# Patient Record
Sex: Male | Born: 1955 | Race: White | Hispanic: No | Marital: Married
Health system: Midwestern US, Academic
[De-identification: ages and names within clinical notes are randomized; demographics above are authoritative.]

---

## 2017-06-24 ENCOUNTER — Ambulatory Visit: Admit: 2017-06-24 | Discharge: 2017-07-08 | Payer: MEDICARE

## 2017-06-27 LAB — COMPREHENSIVE METABOLIC PANEL
Lab: 0.6
Lab: 143 — ABNORMAL LOW (ref 4.70–6.10)
Lab: 3.8
Lab: 6.9
Lab: 81

## 2017-06-27 LAB — LIPID PROFILE
Lab: 13
Lab: 145 — ABNORMAL LOW (ref 150–200)
Lab: 3

## 2017-06-27 LAB — THYROID STIMULATING HORMONE-TSH: Lab: 0.3 — ABNORMAL LOW (ref 0.35–4.94)

## 2017-06-27 LAB — CBC: Lab: 8.3

## 2017-06-27 LAB — PROSTATIC SPECIFIC ANTIGEN-PSA: Lab: 1

## 2017-06-27 LAB — HEMOGLOBIN A1C: Lab: 5.2 — ABNORMAL HIGH (ref 27.0–31.0)

## 2017-07-04 ENCOUNTER — Ambulatory Visit: Admit: 2017-07-04 | Discharge: 2017-07-04 | Payer: MEDICARE

## 2017-07-04 ENCOUNTER — Encounter: Admit: 2017-07-04 | Discharge: 2017-07-04 | Payer: MEDICARE

## 2017-07-04 DIAGNOSIS — C61 Malignant neoplasm of prostate: ICD-10-CM

## 2017-07-04 DIAGNOSIS — G510 Bell's palsy: ICD-10-CM

## 2017-07-04 DIAGNOSIS — H1851 Endothelial corneal dystrophy: ICD-10-CM

## 2017-07-04 DIAGNOSIS — I499 Cardiac arrhythmia, unspecified: ICD-10-CM

## 2017-07-04 DIAGNOSIS — E669 Obesity, unspecified: ICD-10-CM

## 2017-07-04 DIAGNOSIS — J302 Other seasonal allergic rhinitis: ICD-10-CM

## 2017-07-04 DIAGNOSIS — M1990 Unspecified osteoarthritis, unspecified site: ICD-10-CM

## 2017-07-04 DIAGNOSIS — I10 Essential (primary) hypertension: ICD-10-CM

## 2017-07-04 DIAGNOSIS — E119 Type 2 diabetes mellitus without complications: Principal | ICD-10-CM

## 2017-07-08 DIAGNOSIS — C61 Malignant neoplasm of prostate: Principal | ICD-10-CM

## 2017-07-24 ENCOUNTER — Encounter: Admit: 2017-07-24 | Discharge: 2017-07-24 | Payer: MEDICARE

## 2017-07-24 DIAGNOSIS — C61 Malignant neoplasm of prostate: Principal | ICD-10-CM

## 2017-07-24 DIAGNOSIS — E042 Nontoxic multinodular goiter: ICD-10-CM

## 2017-07-24 MED ORDER — RP DX F-18 FLUCICLOVINE MCI
10 | Freq: Once | INTRAVENOUS | 0 refills | Status: CP
Start: 2017-07-24 — End: ?

## 2017-07-25 ENCOUNTER — Encounter: Admit: 2017-07-25 | Discharge: 2017-07-25 | Payer: MEDICARE

## 2017-07-25 DIAGNOSIS — E041 Nontoxic single thyroid nodule: Principal | ICD-10-CM

## 2017-08-03 IMAGING — US THYROID
1 series · 14 of 25 positions shown · non-contrast
Comparison: none

[Series 1: us thyroid · 14 of 66 slices shown]
[im 1/66]
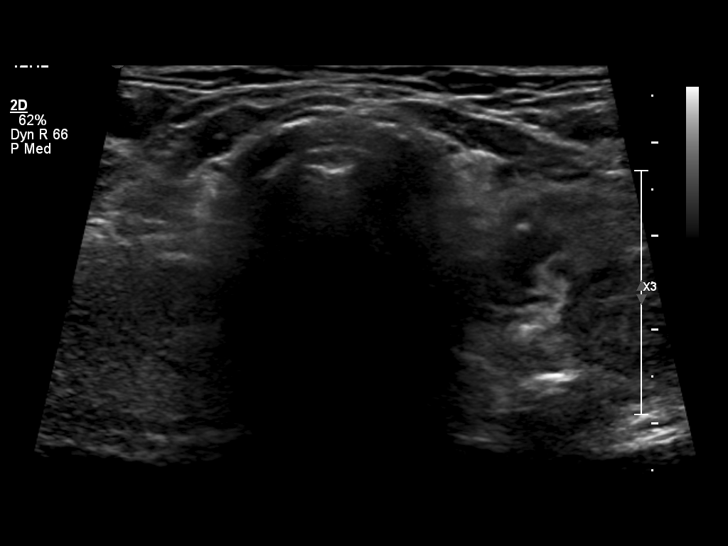
[im 6/66]
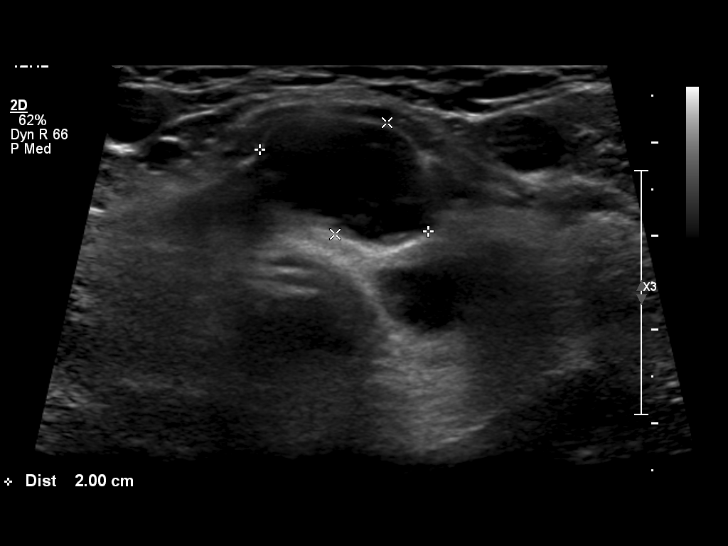
[im 11/66]
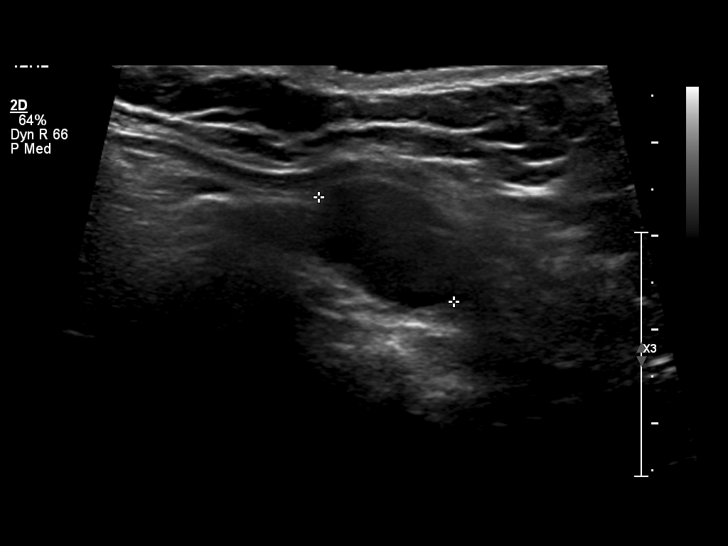
[im 17/66]
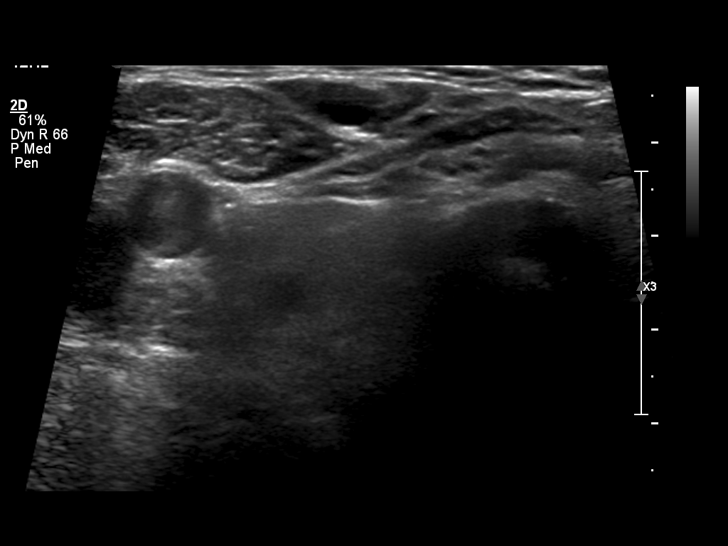
[im 22/66]
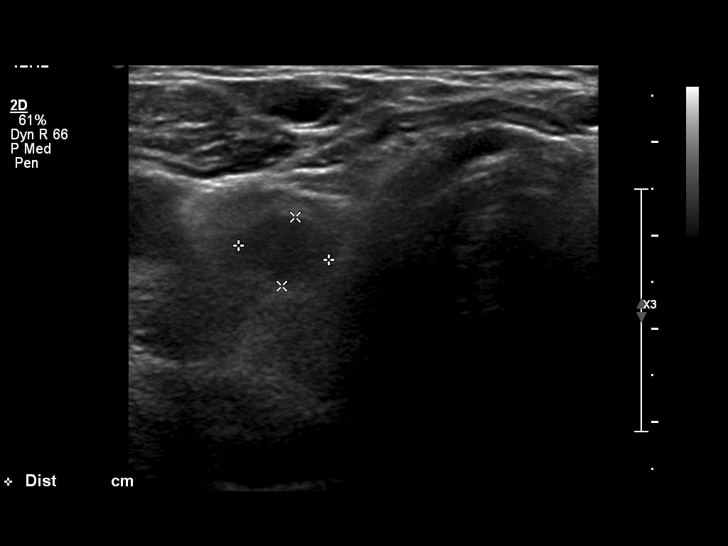
[im 25/66]
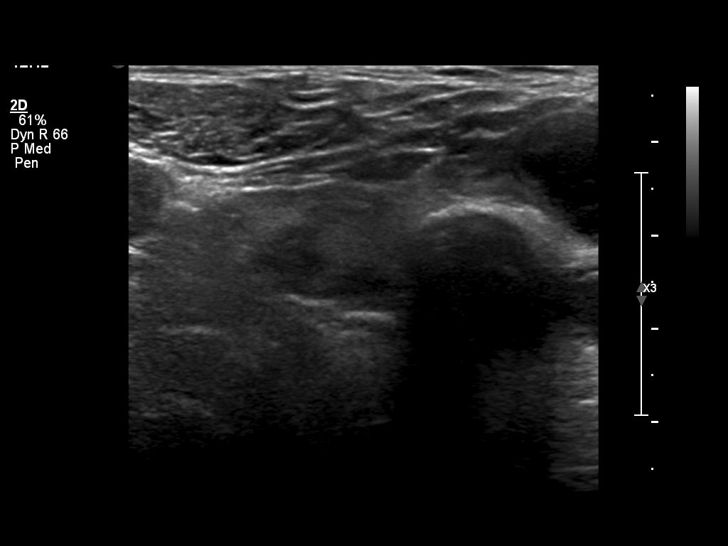
[im 30/66]
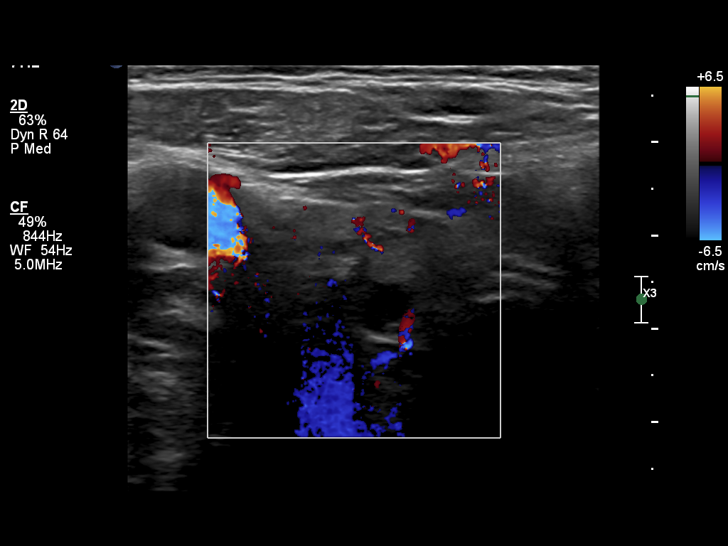
[im 36/66]
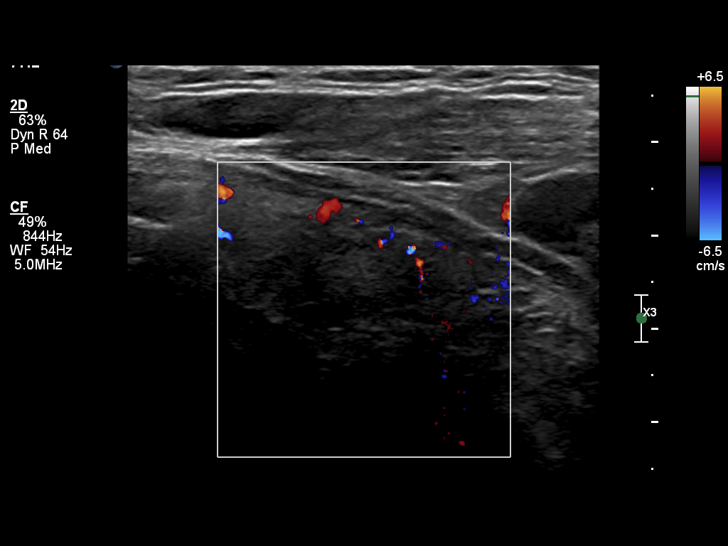
[im 41/66]
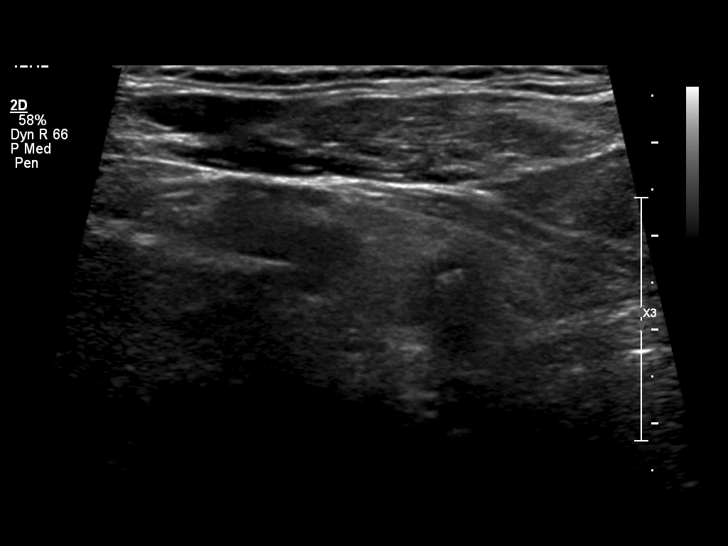
[im 44/66]
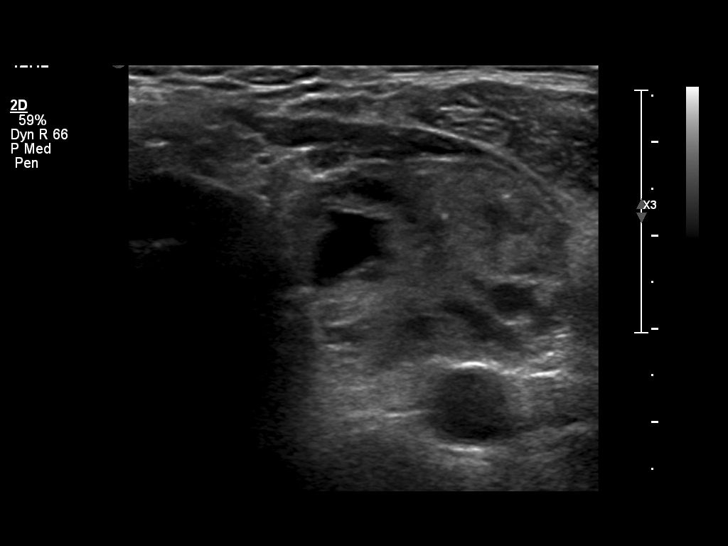
[im 49/66]
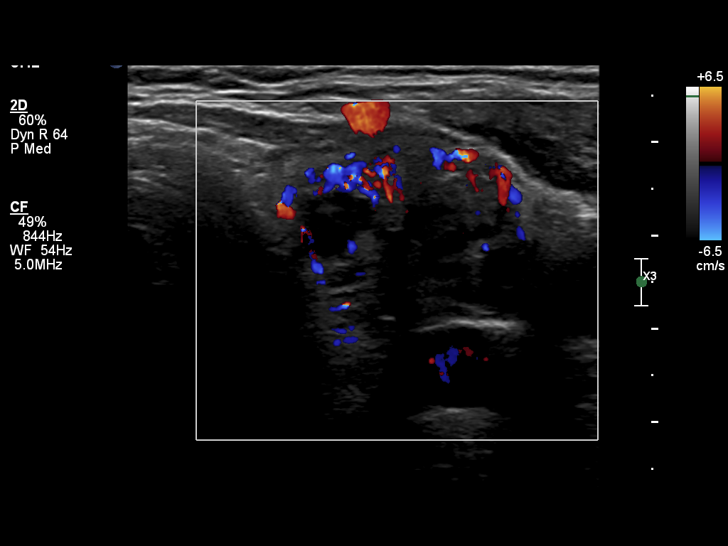
[im 55/66]
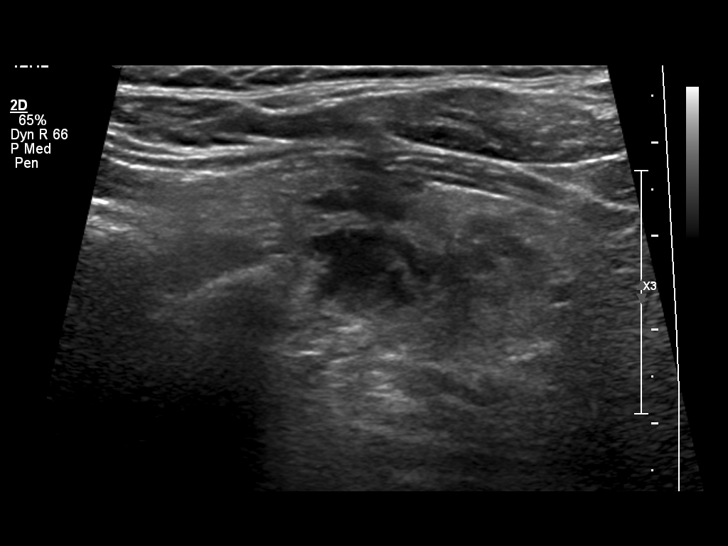
[im 60/66]
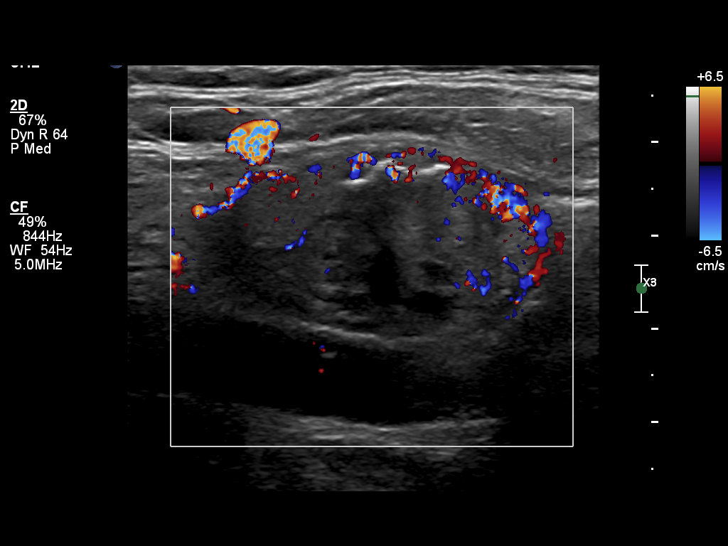
[im 66/66]
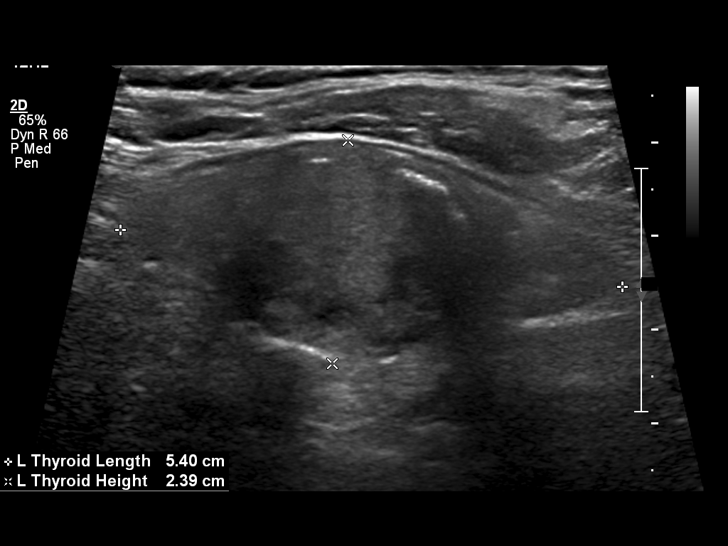

[14 of 25 positions shown; findings below may reference images not displayed]

## 2017-08-04 ENCOUNTER — Encounter: Admit: 2017-08-04 | Discharge: 2017-08-04 | Payer: MEDICARE

## 2017-08-04 DIAGNOSIS — C61 Malignant neoplasm of prostate: Principal | ICD-10-CM

## 2017-08-04 DIAGNOSIS — E041 Nontoxic single thyroid nodule: Principal | ICD-10-CM

## 2017-10-30 ENCOUNTER — Encounter: Admit: 2017-10-30 | Discharge: 2017-10-30 | Payer: MEDICARE

## 2017-11-21 ENCOUNTER — Encounter: Admit: 2017-11-21 | Discharge: 2017-11-21 | Payer: MEDICARE

## 2017-11-21 DIAGNOSIS — G510 Bell's palsy: ICD-10-CM

## 2017-11-21 DIAGNOSIS — M1990 Unspecified osteoarthritis, unspecified site: ICD-10-CM

## 2017-11-21 DIAGNOSIS — I499 Cardiac arrhythmia, unspecified: ICD-10-CM

## 2017-11-21 DIAGNOSIS — H1851 Endothelial corneal dystrophy: ICD-10-CM

## 2017-11-21 DIAGNOSIS — C61 Malignant neoplasm of prostate: ICD-10-CM

## 2017-11-21 DIAGNOSIS — I10 Essential (primary) hypertension: ICD-10-CM

## 2017-11-21 DIAGNOSIS — E669 Obesity, unspecified: ICD-10-CM

## 2017-11-21 DIAGNOSIS — J302 Other seasonal allergic rhinitis: ICD-10-CM

## 2017-11-21 DIAGNOSIS — E119 Type 2 diabetes mellitus without complications: Principal | ICD-10-CM

## 2017-11-23 ENCOUNTER — Encounter: Admit: 2017-11-23 | Discharge: 2017-11-23 | Payer: MEDICARE

## 2017-11-23 DIAGNOSIS — G510 Bell's palsy: ICD-10-CM

## 2017-11-23 DIAGNOSIS — I10 Essential (primary) hypertension: ICD-10-CM

## 2017-11-23 DIAGNOSIS — C61 Malignant neoplasm of prostate: ICD-10-CM

## 2017-11-23 DIAGNOSIS — E669 Obesity, unspecified: ICD-10-CM

## 2017-11-23 DIAGNOSIS — M1990 Unspecified osteoarthritis, unspecified site: ICD-10-CM

## 2017-11-23 DIAGNOSIS — H1851 Endothelial corneal dystrophy: ICD-10-CM

## 2017-11-23 DIAGNOSIS — J302 Other seasonal allergic rhinitis: ICD-10-CM

## 2017-11-23 DIAGNOSIS — E119 Type 2 diabetes mellitus without complications: Principal | ICD-10-CM

## 2017-11-23 DIAGNOSIS — I499 Cardiac arrhythmia, unspecified: ICD-10-CM

## 2017-11-23 DIAGNOSIS — Z720 Tobacco use: ICD-10-CM

## 2017-12-07 ENCOUNTER — Encounter: Admit: 2017-12-07 | Discharge: 2017-12-07 | Payer: MEDICARE

## 2017-12-07 ENCOUNTER — Ambulatory Visit: Admit: 2017-12-07 | Discharge: 2017-12-22 | Payer: MEDICARE

## 2017-12-07 ENCOUNTER — Ambulatory Visit: Admit: 2017-12-07 | Discharge: 2017-12-08 | Payer: MEDICARE

## 2017-12-07 DIAGNOSIS — G510 Bell's palsy: ICD-10-CM

## 2017-12-07 DIAGNOSIS — I10 Essential (primary) hypertension: Principal | ICD-10-CM

## 2017-12-07 DIAGNOSIS — I499 Cardiac arrhythmia, unspecified: ICD-10-CM

## 2017-12-07 DIAGNOSIS — E119 Type 2 diabetes mellitus without complications: Principal | ICD-10-CM

## 2017-12-07 DIAGNOSIS — Z720 Tobacco use: ICD-10-CM

## 2017-12-07 DIAGNOSIS — M1990 Unspecified osteoarthritis, unspecified site: ICD-10-CM

## 2017-12-07 DIAGNOSIS — E669 Obesity, unspecified: ICD-10-CM

## 2017-12-07 DIAGNOSIS — J302 Other seasonal allergic rhinitis: ICD-10-CM

## 2017-12-07 DIAGNOSIS — E118 Type 2 diabetes mellitus with unspecified complications: ICD-10-CM

## 2017-12-07 DIAGNOSIS — H1851 Endothelial corneal dystrophy: ICD-10-CM

## 2017-12-07 DIAGNOSIS — C61 Malignant neoplasm of prostate: ICD-10-CM

## 2017-12-07 DIAGNOSIS — R0600 Dyspnea, unspecified: ICD-10-CM

## 2017-12-07 MED ORDER — CARVEDILOL 25 MG PO TAB
25 mg | ORAL_TABLET | Freq: Two times a day (BID) | ORAL | 3 refills | 90.00000 days | Status: AC
Start: 2017-12-07 — End: 2018-06-13

## 2017-12-12 ENCOUNTER — Encounter: Admit: 2017-12-12 | Discharge: 2017-12-12 | Payer: MEDICARE

## 2017-12-12 DIAGNOSIS — C61 Malignant neoplasm of prostate: Principal | ICD-10-CM

## 2017-12-13 ENCOUNTER — Ambulatory Visit: Admit: 2017-12-13 | Discharge: 2017-12-14 | Payer: MEDICARE

## 2017-12-13 DIAGNOSIS — R0600 Dyspnea, unspecified: ICD-10-CM

## 2017-12-13 DIAGNOSIS — I10 Essential (primary) hypertension: Principal | ICD-10-CM

## 2017-12-13 MED ORDER — PERFLUTREN LIPID MICROSPHERES 1.1 MG/ML IV SUSP
1-20 mL | Freq: Once | INTRAVENOUS | 0 refills | Status: CP | PRN
Start: 2017-12-13 — End: ?

## 2017-12-14 LAB — PROSTATIC SPECIFIC ANTIGEN-PSA

## 2017-12-15 ENCOUNTER — Encounter: Admit: 2017-12-15 | Discharge: 2017-12-15 | Payer: MEDICARE

## 2017-12-15 ENCOUNTER — Ambulatory Visit: Admit: 2017-12-15 | Discharge: 2017-12-16 | Payer: MEDICARE

## 2017-12-15 DIAGNOSIS — I10 Essential (primary) hypertension: Principal | ICD-10-CM

## 2017-12-15 DIAGNOSIS — C61 Malignant neoplasm of prostate: Principal | ICD-10-CM

## 2017-12-15 DIAGNOSIS — R0600 Dyspnea, unspecified: ICD-10-CM

## 2017-12-15 MED ORDER — SODIUM CHLORIDE 0.9 % IV SOLP
250 mL | INTRAVENOUS | 0 refills | Status: AC | PRN
Start: 2017-12-15 — End: ?

## 2017-12-15 MED ORDER — AMINOPHYLLINE 500 MG/20 ML IV SOLN
50 mg | INTRAVENOUS | 0 refills | Status: AC | PRN
Start: 2017-12-15 — End: ?

## 2017-12-15 MED ORDER — ALBUTEROL SULFATE 90 MCG/ACTUATION IN HFAA
2 | RESPIRATORY_TRACT | 0 refills | Status: DC | PRN
Start: 2017-12-15 — End: 2017-12-20

## 2017-12-15 MED ORDER — NITROGLYCERIN 0.4 MG SL SUBL
.4 mg | SUBLINGUAL | 0 refills | Status: AC | PRN
Start: 2017-12-15 — End: ?

## 2017-12-15 MED ORDER — REGADENOSON 0.4 MG/5 ML IV SYRG
.4 mg | Freq: Once | INTRAVENOUS | 0 refills | Status: CP
Start: 2017-12-15 — End: ?

## 2017-12-19 ENCOUNTER — Encounter: Admit: 2017-12-19 | Discharge: 2017-12-19 | Payer: MEDICARE

## 2017-12-25 ENCOUNTER — Encounter: Admit: 2017-12-25 | Discharge: 2017-12-25 | Payer: MEDICARE

## 2018-01-17 ENCOUNTER — Encounter: Admit: 2018-01-17 | Discharge: 2018-01-17 | Payer: MEDICARE

## 2018-01-17 ENCOUNTER — Ambulatory Visit: Admit: 2018-01-17 | Discharge: 2018-01-18 | Payer: MEDICARE

## 2018-01-17 DIAGNOSIS — G510 Bell's palsy: ICD-10-CM

## 2018-01-17 DIAGNOSIS — I499 Cardiac arrhythmia, unspecified: ICD-10-CM

## 2018-01-17 DIAGNOSIS — Z720 Tobacco use: ICD-10-CM

## 2018-01-17 DIAGNOSIS — E119 Type 2 diabetes mellitus without complications: Principal | ICD-10-CM

## 2018-01-17 DIAGNOSIS — J302 Other seasonal allergic rhinitis: ICD-10-CM

## 2018-01-17 DIAGNOSIS — I10 Essential (primary) hypertension: Principal | ICD-10-CM

## 2018-01-17 DIAGNOSIS — H1851 Endothelial corneal dystrophy: ICD-10-CM

## 2018-01-17 DIAGNOSIS — C61 Malignant neoplasm of prostate: ICD-10-CM

## 2018-01-17 DIAGNOSIS — M1990 Unspecified osteoarthritis, unspecified site: ICD-10-CM

## 2018-01-17 DIAGNOSIS — E118 Type 2 diabetes mellitus with unspecified complications: ICD-10-CM

## 2018-01-17 DIAGNOSIS — E669 Obesity, unspecified: ICD-10-CM

## 2018-05-07 ENCOUNTER — Encounter: Admit: 2018-05-07 | Discharge: 2018-05-07 | Payer: MEDICARE

## 2018-05-07 DIAGNOSIS — C61 Malignant neoplasm of prostate: Principal | ICD-10-CM

## 2018-05-08 ENCOUNTER — Encounter: Admit: 2018-05-08 | Discharge: 2018-05-08 | Payer: MEDICARE

## 2018-05-08 DIAGNOSIS — C61 Malignant neoplasm of prostate: Principal | ICD-10-CM

## 2018-05-08 LAB — PROSTATIC SPECIFIC ANTIGEN-PSA

## 2018-05-09 ENCOUNTER — Ambulatory Visit: Admit: 2018-05-09 | Discharge: 2018-05-24 | Payer: MEDICARE

## 2018-05-16 ENCOUNTER — Encounter: Admit: 2018-05-16 | Discharge: 2018-05-16 | Payer: MEDICARE

## 2018-05-16 DIAGNOSIS — Z08 Encounter for follow-up examination after completed treatment for malignant neoplasm: Principal | ICD-10-CM

## 2018-05-16 DIAGNOSIS — C61 Malignant neoplasm of prostate: ICD-10-CM

## 2018-05-16 DIAGNOSIS — H1851 Endothelial corneal dystrophy: ICD-10-CM

## 2018-05-16 DIAGNOSIS — Z720 Tobacco use: ICD-10-CM

## 2018-05-16 DIAGNOSIS — I499 Cardiac arrhythmia, unspecified: ICD-10-CM

## 2018-05-16 DIAGNOSIS — I10 Essential (primary) hypertension: ICD-10-CM

## 2018-05-16 DIAGNOSIS — M1990 Unspecified osteoarthritis, unspecified site: ICD-10-CM

## 2018-05-16 DIAGNOSIS — J302 Other seasonal allergic rhinitis: ICD-10-CM

## 2018-05-16 DIAGNOSIS — E669 Obesity, unspecified: ICD-10-CM

## 2018-05-16 DIAGNOSIS — G510 Bell's palsy: ICD-10-CM

## 2018-05-16 DIAGNOSIS — E119 Type 2 diabetes mellitus without complications: Principal | ICD-10-CM

## 2018-05-19 ENCOUNTER — Encounter: Admit: 2018-05-19 | Discharge: 2018-05-19 | Payer: MEDICARE

## 2018-05-19 DIAGNOSIS — G510 Bell's palsy: ICD-10-CM

## 2018-05-19 DIAGNOSIS — Z720 Tobacco use: ICD-10-CM

## 2018-05-19 DIAGNOSIS — E669 Obesity, unspecified: ICD-10-CM

## 2018-05-19 DIAGNOSIS — E119 Type 2 diabetes mellitus without complications: Principal | ICD-10-CM

## 2018-05-19 DIAGNOSIS — J302 Other seasonal allergic rhinitis: ICD-10-CM

## 2018-05-19 DIAGNOSIS — I499 Cardiac arrhythmia, unspecified: ICD-10-CM

## 2018-05-19 DIAGNOSIS — H1851 Endothelial corneal dystrophy: ICD-10-CM

## 2018-05-19 DIAGNOSIS — I10 Essential (primary) hypertension: ICD-10-CM

## 2018-05-19 DIAGNOSIS — M1990 Unspecified osteoarthritis, unspecified site: ICD-10-CM

## 2018-05-19 DIAGNOSIS — C61 Malignant neoplasm of prostate: ICD-10-CM

## 2018-05-24 ENCOUNTER — Ambulatory Visit: Admit: 2018-05-16 | Discharge: 2018-05-16 | Payer: MEDICARE

## 2018-05-24 DIAGNOSIS — Z08 Encounter for follow-up examination after completed treatment for malignant neoplasm: Principal | ICD-10-CM

## 2018-05-24 DIAGNOSIS — C61 Malignant neoplasm of prostate: ICD-10-CM

## 2018-06-13 ENCOUNTER — Encounter: Admit: 2018-06-13 | Discharge: 2018-06-13 | Payer: MEDICARE

## 2018-06-13 MED ORDER — CARVEDILOL 25 MG PO TAB
12.5 mg | ORAL_TABLET | Freq: Two times a day (BID) | ORAL | 1 refills | 90.00000 days | Status: AC
Start: 2018-06-13 — End: 2018-06-14

## 2018-06-14 MED ORDER — CARVEDILOL 12.5 MG PO TAB
12.5 mg | ORAL_TABLET | Freq: Two times a day (BID) | ORAL | 3 refills | 90.00000 days | Status: AC
Start: 2018-06-14 — End: 2019-02-12

## 2018-07-27 ENCOUNTER — Encounter: Admit: 2018-07-27 | Discharge: 2018-07-27 | Payer: MEDICARE

## 2018-07-27 DIAGNOSIS — C61 Malignant neoplasm of prostate: Secondary | ICD-10-CM

## 2018-07-27 LAB — PROSTATIC SPECIFIC ANTIGEN-PSA

## 2019-01-21 ENCOUNTER — Ambulatory Visit: Admit: 2019-01-21 | Discharge: 2019-01-22

## 2019-01-21 ENCOUNTER — Encounter: Admit: 2019-01-21 | Discharge: 2019-01-21

## 2019-01-21 DIAGNOSIS — G510 Bell's palsy: Secondary | ICD-10-CM

## 2019-01-21 DIAGNOSIS — M1990 Unspecified osteoarthritis, unspecified site: Secondary | ICD-10-CM

## 2019-01-21 DIAGNOSIS — J302 Other seasonal allergic rhinitis: Secondary | ICD-10-CM

## 2019-01-21 DIAGNOSIS — I499 Cardiac arrhythmia, unspecified: Secondary | ICD-10-CM

## 2019-01-21 DIAGNOSIS — Z1159 Encounter for screening for other viral diseases: Secondary | ICD-10-CM

## 2019-01-21 DIAGNOSIS — I10 Essential (primary) hypertension: Secondary | ICD-10-CM

## 2019-01-21 DIAGNOSIS — E119 Type 2 diabetes mellitus without complications: Secondary | ICD-10-CM

## 2019-01-21 DIAGNOSIS — I4891 Unspecified atrial fibrillation: Secondary | ICD-10-CM

## 2019-01-21 DIAGNOSIS — C61 Malignant neoplasm of prostate: Secondary | ICD-10-CM

## 2019-01-21 DIAGNOSIS — H1851 Endothelial corneal dystrophy: Secondary | ICD-10-CM

## 2019-01-21 DIAGNOSIS — E669 Obesity, unspecified: Secondary | ICD-10-CM

## 2019-01-21 DIAGNOSIS — Z720 Tobacco use: Secondary | ICD-10-CM

## 2019-01-21 MED ORDER — HYDROCHLOROTHIAZIDE 50 MG PO TAB
50 mg | ORAL_TABLET | Freq: Every morning | ORAL | 3 refills | 28.00000 days | Status: DC
Start: 2019-01-21 — End: 2019-02-19

## 2019-01-21 MED ORDER — APIXABAN 5 MG PO TAB
5 mg | ORAL_TABLET | Freq: Two times a day (BID) | ORAL | 11 refills | Status: DC
Start: 2019-01-21 — End: 2019-02-19

## 2019-01-21 NOTE — Progress Notes
Date of Service: 01/21/2019    Tristan Rosales is a 63 y.o. male.       HPI     Mr. Voiles is a 63 year old white male, he was last seen in the office in June 2019.  In the past patient was evaluated with an echocardiogram and a perfusion imaging study in May 2019, overall the studies were low risk.    He does not report having symptoms of chest pain, shortness of breath or heart palpitations.    We did obtain a 12-lead EKG today in the office and patient did have atrial fibrillation with a heart rate of 99 bpm.  Patient has not been experiencing any symptoms and therefore we do not know the duration of the present episode.    He recalls that probably in 2006 when he underwent knee surgery he was found to have atrial fibrillation in the preoperative setting and the surgery was canceled.    He is morbidly obese with a BMI of 43.89, patient also has sleep apnea and does use a CPAP machine.         Vitals:    01/21/19 1000 01/21/19 1004   BP: 122/80 116/76   BP Source: Arm, Left Upper Arm, Right Upper   Pulse: 95    SpO2: 94%    Weight: (!) 163.6 kg (360 lb 9.6 oz)    Height: 1.93 m (6' 4)    PainSc: Four      Body mass index is 43.89 kg/m???.     Past Medical History  Patient Active Problem List    Diagnosis Date Noted   ??? Tobacco abuse 11/23/2017   ??? Essential hypertension 10/19/2012   ??? Osteoarthritis 10/19/2012   ??? Morbid obesity (HCC) 10/19/2012   ??? Diabetes mellitus (HCC) 10/19/2012   ??? Prostate cancer (HCC) 10/02/2012     TRUS-Bx, by Dr. [name] 08/17/2012 3+4 in 2 cores on L, PSA 6.97, negative CT             Review of Systems   Constitution: Positive for malaise/fatigue.   HENT: Positive for congestion and tinnitus.    Eyes: Positive for blurred vision.   Cardiovascular: Positive for leg swelling.   Respiratory: Negative.    Endocrine: Positive for polyuria.   Hematologic/Lymphatic: Negative.    Skin: Negative.    Musculoskeletal: Positive for arthritis, back pain, joint pain, muscle weakness, myalgias, neck pain and stiffness.   Gastrointestinal: Negative.    Genitourinary: Positive for frequency, incomplete emptying, nocturia and pelvic pain.   Neurological: Positive for numbness.   Psychiatric/Behavioral: Negative.    Allergic/Immunologic: Positive for environmental allergies.       Physical Exam  General Appearance: obese  Skin: warm, moist, no ulcers or xanthomas  Eyes: conjunctivae and lids normal, pupils are equal and round  Lips & Oral Mucosa: no pallor or cyanosis  Neck Veins: neck veins are flat, neck veins are not distended  Chest Inspection: chest is normal in appearance  Respiratory Effort: breathing comfortably, no respiratory distress  Auscultation/Percussion: lungs clear to auscultation, no rales or rhonchi, no wheezing  Cardiac Rhythm: Irregular irregular rhythm  Cardiac Auscultation: S1, S2 normal, no rub, no gallop  Murmurs: no murmur  Carotid Arteries: normal carotid upstroke bilaterally, no bruit  Abdominal aorta: could not be examined due to obese adomen  Lower Extremity Edema: no lower extremity edema  Abdominal Exam: soft, non-tender, no masses, bowel sounds normal  Liver & Spleen: no organomegaly  Language and Memory: patient  responsive and seems to comprehend information  Neurologic Exam: neurological assessment grossly intact        Cardiovascular Studies  Twelve-lead EKG demonstrates atrial fibrillation, ventricular rate 99 bpm    Problems Addressed Today  Encounter Diagnoses   Name Primary?   ??? Essential hypertension Yes   ??? Atrial fibrillation, unspecified type (HCC)    ??? Tobacco abuse    ??? Prostate cancer (HCC)    ??? Morbid obesity (HCC)    ??? New onset atrial fibrillation (HCC)        Assessment and Plan     In summary: This is a 63 year old white male that has new onset, unknown duration of the present episode of atrial fibrillation, patient has been completely asymptomatic, his heart rate is well controlled, he is currently not anticoagulated. Other comorbidities include morbid obesity with a BMI of 43.89, hypertension and hyperlipidemia.    Plan:    1.  Increase hydrochlorothiazide to 50 mg p.o. daily, this was done due to mild bilateral pretibial edema  2.  Start anticoagulation with a factor Xa inhibitor  3.  We will schedule this patient to undergo a TEE guided cardioversion at [facility] in the near future  4.  Follow-up with me in the office following the above procedure.         Current Medications (including today's revisions)  ??? acetaminophen (TYLENOL) 325 mg tablet Take 500 mg by mouth three times daily.   ??? aspirin 81 mg chewable tablet Take 81 mg by mouth daily.   ??? carvedilol (COREG) 12.5 mg tablet Take one tablet by mouth twice daily. Take with food. (Patient taking differently: Take 6.25 mg by mouth twice daily. Take with food.)   ??? diclofenac sodium DR (VOLTAREN) 75 mg tablet Take 1 tablet by mouth twice daily.   ??? DOCOSAHEXANOIC ACID/EPA (FISH OIL PO) Take  by mouth.   ??? fluticasone propionate (FLONASE) 50 mcg/actuation nasal spray, suspension Apply 2 sprays to each nostril as directed daily. Shake bottle gently before using.   ??? GLUCOSAM-CHONDRO-HERB 149-HYAL (GLUCOS CHOND CPLX ADVANCED PO) Take  by mouth.   ??? hydrochlorothiazide (HYDRODIURIL) 25 mg tablet Take 25 mg by mouth daily.   ??? HYDROcodone/acetaminophen (NORCO) 7.5/325 mg tablet Take 1 tablet by mouth four times daily as needed   ??? lisinopriL (ZESTRIL) 40 mg tablet Take 40 mg by mouth daily.   ??? loratadine (CLARITIN) 10 mg tablet Take 10 mg by mouth daily.   ??? metformin-ER(+) (FORTAMET) 1,000 mg tablet Take 1,000 mg by mouth twice daily.   ??? MULTIVITAMIN W-MINERALS/LUTEIN (CENTRUM SILVER PO) Take  by mouth.   ??? NIFEdipine SR (PROCARDIA-XL; ADALAT CC) 60 mg tablet Take 60 mg by mouth twice daily.   ??? omeprazole DR (PRILOSEC) 40 mg capsule Take 40 mg by mouth daily.   ??? prednisolone acetate (PRED FORTE) 1 % ophthalmic suspension twice daily. ??? traMADol (ULTRAM) 50 mg tablet Take 100 mg by mouth four times daily.   ??? triamcinolone acetonide (KENALOG) 0.1 % topical cream twice daily as needed.   ??? trolamine salicylate (ASPERCREME TP) Apply  topically to affected area as Needed.

## 2019-01-21 NOTE — Patient Instructions
It was nice to see you today.     We discussed: Start blood thinner to reduce risk for stroke.      We will schedule you for a TEE/Cardioversion in the next few weeks.  We will call you with date and time.  This will be done at [facility].             My nurse's Telford Nab and Roselyn Reef) number is [phone].

## 2019-01-31 ENCOUNTER — Encounter: Admit: 2019-01-31 | Discharge: 2019-01-31

## 2019-01-31 DIAGNOSIS — C61 Malignant neoplasm of prostate: Secondary | ICD-10-CM

## 2019-01-31 NOTE — Telephone Encounter
I left a message to have Richardson Landry call me for results & plan to repeat PSA in 6 months

## 2019-02-01 ENCOUNTER — Encounter: Admit: 2019-02-01 | Discharge: 2019-02-01

## 2019-02-01 NOTE — Telephone Encounter
02/01/2019 9:39 AM   Received call from patient's spouse requesting COVID-19 order be faxed to [facility] as requested.  Wilder Glade, LPN

## 2019-02-07 ENCOUNTER — Encounter: Admit: 2019-02-07 | Discharge: 2019-02-07

## 2019-02-07 NOTE — Telephone Encounter
Pt Phone # [phone]  VM okay? NA  Type of Test DCCV  Other Appts none      Check in time:   8 am    Please check in at the Cardiovascular Medicine clinic     Have you had any recent Cold, flu, or infections: No  Please have nothing by mouth after midnight 02/08/2019  (MAC anes 8 hrs) (Dobut 4 hrs) (treadmill 4 hr )    Medications:  Hold these meds-  Metformin, HCTZ and vitamins  If on insulin: Do you have an insulin pump in place?   Take these meds with sips of water the morning of your test: all other am medications if tolerated, especially no missed doses of eliquis.  Anticoagulation: Eliquis  any missed doses?   No    Please wear comfortable clothing and comfortable shoes if walking on treadmill,   Please refrain from applying lotions to the skin prior to your test,  Please remove metal jewelry if having DCCV.    Pt verbalized all understanding. He did want to know about his COVID-19 results. I did not see results, but pt states he had it done at [facility]. I let pt and wife know to call them, so they can release those results to Arma before tomorrow. He will reach out to [facility]. Travel screen completed and let pt and wife know to wear masks in order to enter the hospital and wife would have temperature checked upon entrance.  They verbalized all understanding and had no further questions or concerns.    Name of driver if sedated: Vaughan Basta, pt's wife        RN Conception Oms      Call TEE at [phone] for any other questions  [facility] [phone]/Scheduling [place], [place] [id number]

## 2019-02-08 ENCOUNTER — Ambulatory Visit: Admit: 2019-02-08 | Discharge: 2019-02-08

## 2019-02-08 ENCOUNTER — Encounter: Admit: 2019-02-08 | Discharge: 2019-02-08

## 2019-02-08 DIAGNOSIS — H1851 Endothelial corneal dystrophy: Secondary | ICD-10-CM

## 2019-02-08 DIAGNOSIS — I4891 Unspecified atrial fibrillation: Secondary | ICD-10-CM

## 2019-02-08 DIAGNOSIS — I499 Cardiac arrhythmia, unspecified: Secondary | ICD-10-CM

## 2019-02-08 DIAGNOSIS — I4819 Other persistent atrial fibrillation: Secondary | ICD-10-CM

## 2019-02-08 DIAGNOSIS — C61 Malignant neoplasm of prostate: Secondary | ICD-10-CM

## 2019-02-08 DIAGNOSIS — E669 Obesity, unspecified: Secondary | ICD-10-CM

## 2019-02-08 DIAGNOSIS — I10 Essential (primary) hypertension: Secondary | ICD-10-CM

## 2019-02-08 DIAGNOSIS — E119 Type 2 diabetes mellitus without complications: Secondary | ICD-10-CM

## 2019-02-08 DIAGNOSIS — G510 Bell's palsy: Secondary | ICD-10-CM

## 2019-02-08 DIAGNOSIS — J302 Other seasonal allergic rhinitis: Secondary | ICD-10-CM

## 2019-02-08 DIAGNOSIS — Z720 Tobacco use: Secondary | ICD-10-CM

## 2019-02-08 DIAGNOSIS — M1990 Unspecified osteoarthritis, unspecified site: Secondary | ICD-10-CM

## 2019-02-08 LAB — POC GLUCOSE: Lab: 89 mg/dL (ref 70–100)

## 2019-02-08 MED ORDER — SODIUM CHLORIDE 0.9 % IV SOLP (OR) 500ML
0 refills | Status: DC
Start: 2019-02-08 — End: 2019-02-08
  Administered 2019-02-08: 18:00:00 via INTRAVENOUS

## 2019-02-08 MED ORDER — KETAMINE 10 MG/ML IJ SOLN
0 refills | Status: DC
Start: 2019-02-08 — End: 2019-02-08
  Administered 2019-02-08: 18:00:00 20 mg via INTRAVENOUS

## 2019-02-08 MED ORDER — POTASSIUM CHLORIDE 10 MEQ PO TBTQ
10 meq | Freq: Once | ORAL | 0 refills | Status: CP
Start: 2019-02-08 — End: ?
  Administered 2019-02-08: 14:00:00 10 meq via ORAL

## 2019-02-08 MED ORDER — PROPOFOL 10 MG/ML IV EMUL 20 ML (INFUSION)(AM)(OR)
INTRAVENOUS | 0 refills | Status: DC
Start: 2019-02-08 — End: 2019-02-08
  Administered 2019-02-08: 18:00:00 120 ug/kg/min via INTRAVENOUS

## 2019-02-08 MED ORDER — PROPOFOL INJ 10 MG/ML IV VIAL
0 refills | Status: DC
Start: 2019-02-08 — End: 2019-02-08

## 2019-02-08 MED ORDER — ONDANSETRON HCL (PF) 4 MG/2 ML IJ SOLN
4 mg | Freq: Once | INTRAVENOUS | 0 refills | Status: CN | PRN
Start: 2019-02-08 — End: ?

## 2019-02-08 MED ORDER — LIDOCAINE (PF) 200 MG/10 ML (2 %) IJ SYRG
0 refills | Status: DC
Start: 2019-02-08 — End: 2019-02-08
  Administered 2019-02-08: 18:00:00 60 mg via INTRAVENOUS

## 2019-02-08 MED ORDER — POTASSIUM CHLORIDE 20 MEQ PO TBTQ
40 meq | Freq: Once | ORAL | 0 refills | Status: CP
Start: 2019-02-08 — End: ?

## 2019-02-08 NOTE — Telephone Encounter
02/08/2019 2:07 PM   Received call from Dr Virgina Organ requesting patient be scheduled for a 1 month follow up. Also eliquis is too expensive and Dr Virgina Organ is changing the patient to warfarin. She would like him to come in in 1-2 weeks for coumadin teaching and warfarin start. Attempted to reach patient, but there was no answer. Will try again.  Wilder Glade, LPN

## 2019-02-08 NOTE — Anesthesia Post-Procedure Evaluation
Post-Anesthesia Evaluation    Name: Tristan Rosales      MRN: [id number]     DOB: 10-14-1955     Age: 63 y.o.     Sex: male   __________________________________________________________________________     Procedure Information     Anesthesia Start Date/Time:  02/08/19 1235    Scheduled providers:  Trudie Buckler, MD    Procedure:  CARDIOVERSION, EXTERNAL    Location:  [facility]  BP: 134/91 (07/17 1333)  Pulse: 84 (07/17 1333)  Respirations: 11 PER MINUTE (07/17 1333)  SpO2: 93 % (07/17 1333)  Height: 193 cm (76") (07/17 1307)   Vitals Value Taken Time   BP 134/91 02/08/2019  1:33 PM   Temp     Pulse 84 02/08/2019  1:33 PM   Respirations 11 PER MINUTE 02/08/2019  1:33 PM   SpO2 93 % 02/08/2019  1:33 PM         Post Anesthesia Evaluation Note    Evaluation location: Pre/Post  Patient participation: recovered; patient participated in evaluation  Level of consciousness: alert    Pain score: 0  Pain management: adequate    Hydration: normovolemia  Temperature: 36.0C - 38.4C  Airway patency: adequate    Perioperative Events       Post-op nausea and vomiting: no PONV    Postoperative Status  Cardiovascular status: hemodynamically stable  Respiratory status: spontaneous ventilation  Follow-up needed: none  Additional comments: Post-Anesthesia Evaluation Attestation: I reviewed and agree the indicated post-anesthesia care was provided. I have reviewed key portions of the indicated post anesthesia care. I have examined the patient's vitals, physical status, and complications and agree with what is documented.    Staff name:  Trudie Buckler, MD Date:  02/08/2019          Perioperative Events  Perioperative Event: No  Emergency Case Activation: No

## 2019-02-08 NOTE — Progress Notes
Pre-Operative Assessment for TEE or Cardioversion    Date of Service:  02/08/2019    Tristan Rosales is a 63 y.o. y.o. male. He is referred for TEE and Cardioversion Indication: atrial fibrillation .      he has been compliant with his  apixaban (Eliquis) Missed dose: Noand ... bleeding. he is Positive for: none and Positive for: Sleep Apnea/OSA, CPAP, Smoker and Chronic Pain Meds.      GI procedures: none          When: NA    Chest pain:  No   SOB: No         Medical History:  Medical History:   Diagnosis Date   ??? Bell's palsy    ??? Cardiac arrhythmia     extra beats   ??? DM (diabetes mellitus) (HCC)    ??? Fuchs' corneal dystrophy    ??? HTN (hypertension)    ??? OA (osteoarthritis)    ??? Obesity    ??? Prostate cancer (HCC)     Intermediate risk. Gleason 7, PSa 7   ??? Seasonal allergies    ??? Tobacco abuse 11/23/2017        Surgical History:   Surgical History:   Procedure Laterality Date   ??? TONSILLECTOMY  1963   ??? HIP ARTHROPLASTY  2011    right   ??? CATARACT REMOVAL Bilateral 2016   ??? KNEE ARTHROPLASTY  2006, 2007    bilateral   ??? LIPOMA RESECTION      multiple       Social History     Social History     Tobacco Use   ??? Smoking status: Former Smoker     Packs/day: 0.30     Years: 24.00     Pack years: 7.20     Types: Cigarettes     Last attempt to quit: 06/26/2017     Years since quitting: 1.6   ??? Smokeless tobacco: Never Used   Substance Use Topics   ??? Alcohol use: Yes     Alcohol/week: 3.0 standard drinks     Types: 3 Cans of beer per week   ??? Drug use: No         Allergies                                        Allergies   Allergen Reactions   ??? Pcn [Penicillins] RASH   ??? Seasonal Allergies SNEEZING          Current Medications  Current Outpatient Medications on File Prior to Encounter   Medication Sig Dispense Refill   ??? acetaminophen (TYLENOL) 325 mg tablet Take 500 mg by mouth three times daily.     ??? apixaban (ELIQUIS) 5 mg tablet Take one tablet by mouth twice daily. 60 tablet 11 ??? aspirin 81 mg chewable tablet Take 81 mg by mouth daily.     ??? carvedilol (COREG) 12.5 mg tablet Take one tablet by mouth twice daily. Take with food. 180 tablet 3   ??? diclofenac sodium DR (VOLTAREN) 75 mg tablet Take 1 tablet by mouth twice daily.     ??? DOCOSAHEXANOIC ACID/EPA (FISH OIL PO) Take  by mouth.     ??? fluticasone propionate (FLONASE) 50 mcg/actuation nasal spray, suspension Apply 2 sprays to each nostril as directed daily. Shake bottle gently before using.     ??? GLUCOSAM-CHONDRO-HERB 149-HYAL (  GLUCOS CHOND CPLX ADVANCED PO) Take  by mouth.     ??? hydroCHLOROthiazide (HYDRODIURIL) 50 mg tablet Take one tablet by mouth every morning. 90 tablet 3   ??? HYDROcodone/acetaminophen (NORCO) 7.5/325 mg tablet Take 1 tablet by mouth four times daily as needed  0   ??? lisinopriL (ZESTRIL) 40 mg tablet Take 40 mg by mouth daily.     ??? loratadine (CLARITIN) 10 mg tablet Take 10 mg by mouth daily.     ??? metformin-ER(+) (FORTAMET) 1,000 mg tablet Take 1,000 mg by mouth twice daily.     ??? MULTIVITAMIN W-MINERALS/LUTEIN (CENTRUM SILVER PO) Take  by mouth.     ??? NIFEdipine SR (PROCARDIA-XL; ADALAT CC) 60 mg tablet Take 60 mg by mouth twice daily.     ??? omeprazole DR (PRILOSEC) 40 mg capsule Take 40 mg by mouth daily.     ??? prednisolone acetate (PRED FORTE) 1 % ophthalmic suspension twice daily.     ??? traMADol (ULTRAM) 50 mg tablet Take 100 mg by mouth four times daily.     ??? triamcinolone acetonide (KENALOG) 0.1 % topical cream twice daily as needed.  1   ??? trolamine salicylate (ASPERCREME TP) Apply  topically to affected area as Needed.       No current facility-administered medications on file prior to encounter.        Vitals  Estimated body mass index is 42.6 kg/m??? as calculated from the following:    Height as of this encounter: 1.93 m (6' 4).    Weight as of this encounter: 158.8 kg (350 lb).       Patient appears alert and oriented: Yes  NPO: for greater than 8 hours Inpatient IV status: Outpatient IV put in right hand 22 G    Diagnostic Tests  White Blood Cells   Date Value Ref Range Status   06/27/2017 8.3  Final     Hemoglobin   Date Value Ref Range Status   06/27/2017 14.6  Final     Hematocrit   Date Value Ref Range Status   06/27/2017 43.7  Final     Platelet Count   Date Value Ref Range Status   06/27/2017 274  Final     Sodium   Date Value Ref Range Status   06/27/2017 143  Final     Potassium   Date Value Ref Range Status   06/27/2017 3.4 (L) 3.5 - 5.1 Final     No results found for: MG  Blood Urea Nitrogen   Date Value Ref Range Status   06/27/2017 15.0  Final     Creatinine   Date Value Ref Range Status   06/27/2017 0.79  Final     Glucose   Date Value Ref Range Status   06/27/2017 89  Final       Last MAC INR Flow Sheet Entry:    Last recorded Lab results:   No results found for: INR  No results found for: PTT        Blood Cultures  Resulted Micro Last 72 Hrs    No results found         Last TEE date: None  Last Cardioversion date: None  Echo procedures within the past 30 days:  No results found.      Device Information on File  No results found for: GENERATOR, EPDEVTYP      Additional Comments:  None    Plan:  Dr. Dr. Avie Arenas will plan to proceed  with the  TEE and Cardioversion.

## 2019-02-08 NOTE — Progress Notes
8:45 am: Pt here for TEE/DCCV. Potassium POC done and potassium was 3.2. Notified Dr. Virgina Organ. Verbal order to give 20 MEQ potassium po and to recheck one hour later.     9:45 am: POC potasium rechecked and potassium up to 3.4. Notified Dr. Virgina Organ and she wants another 65 MEQ potassium po and to recheck potassium again one hour later. Communicated this with pt and wife, Vaughan Basta. They verbalized all understanding. Pt moved to holding and will attempt to do procedure again at noon. Will continue to monitor pt at this time.

## 2019-02-08 NOTE — Patient Instructions
CARDIOLOGY PROCEDURES           POST SEDATION INSTRUCTIONS      Patient Name: Tristan Rosales  MRN#: [id number]  Date: 02/08/2019      Please follow the instructions listed below    Resume diet as prescribed when gag reflex is present.  This typically occurs within one hour.    ??? The following day you may experience a minor sore throat.     Please have someone accompany you, as YOU SHOULD NOT drive or operate machinery for at least 12-24 hours following the procedure.    ??? There may be some residual effects from the sedatives during the procedure.  ??? Do not drive a vehicle for up to 24 hours after receiving sedation.  ??? Do not operate heavy or potentially harmful equipment  ??? Do not make legally binding decisions  ??? Do not drink alcohol for up to 24 hours  ??? Do not communicate through social media for 24 hours.    Other instructions: Continue to take all medications as prescribed, especially no missed doses in the first 30 days and thereafter.     If you have question or concerns about this procedure, please contact the Cardiology office at [phone], and ask to speak to one of the nurses.      Current Medications List:  ??? acetaminophen (TYLENOL) 325 mg tablet Take 500 mg by mouth three times daily.   ??? apixaban (ELIQUIS) 5 mg tablet Take one tablet by mouth twice daily.   ??? aspirin 81 mg chewable tablet Take 81 mg by mouth daily.   ??? carvedilol (COREG) 12.5 mg tablet Take one tablet by mouth twice daily. Take with food.   ??? diclofenac sodium DR (VOLTAREN) 75 mg tablet Take 1 tablet by mouth twice daily.   ??? DOCOSAHEXANOIC ACID/EPA (FISH OIL PO) Take  by mouth.   ??? fluticasone propionate (FLONASE) 50 mcg/actuation nasal spray, suspension Apply 2 sprays to each nostril as directed daily. Shake bottle gently before using.   ??? GLUCOSAM-CHONDRO-HERB 149-HYAL (GLUCOS CHOND CPLX ADVANCED PO) Take  by mouth.   ??? hydroCHLOROthiazide (HYDRODIURIL) 50 mg tablet Take one tablet by mouth every morning. ??? HYDROcodone/acetaminophen (NORCO) 7.5/325 mg tablet Take 1 tablet by mouth four times daily as needed   ??? lisinopriL (ZESTRIL) 40 mg tablet Take 40 mg by mouth daily.   ??? loratadine (CLARITIN) 10 mg tablet Take 10 mg by mouth daily.   ??? metformin-ER(+) (FORTAMET) 1,000 mg tablet Take 1,000 mg by mouth twice daily.   ??? MULTIVITAMIN W-MINERALS/LUTEIN (CENTRUM SILVER PO) Take  by mouth.   ??? NIFEdipine SR (PROCARDIA-XL; ADALAT CC) 60 mg tablet Take 60 mg by mouth twice daily.   ??? omeprazole DR (PRILOSEC) 40 mg capsule Take 40 mg by mouth daily.   ??? prednisolone acetate (PRED FORTE) 1 % ophthalmic suspension twice daily.   ??? traMADol (ULTRAM) 50 mg tablet Take 100 mg by mouth four times daily.   ??? triamcinolone acetonide (KENALOG) 0.1 % topical cream twice daily as needed.   ??? trolamine salicylate (ASPERCREME TP) Apply  topically to affected area as Needed.           Discharge Instructions for Cardioversion  Your healthcare provider did a procedure called cardioversion. He or she used a controlled electric shock or a medicine to briefly stop all electrical activity in your heart. This helped restore your heart???s normal rhythm. Here are some instructions to follow while you recover.  Home  care  ??? Before cardioversion, you will typically be given sedation. So, you won't be able to drive home. You will need a ride. Wait at least 24 hours before driving a car or operating heavy machinery after getting sedating medicines.  ??? The skin on your chest may be irritated or feel like it's sunburned. Your healthcare provider may prescribe a soothing lotion to ease this discomfort.???These minor symptoms will go away in a few days.  ??? Ask your healthcare provider about medicines to keep your heart rhythm steady.  ??? If you were prescribed medicine, take it as instructed by your healthcare provider. Don???t skip doses or take double doses. Cardioversion requires blood thinners for at least 4 weeks after the procedure to prevent a delayed risk of stroke when treating atrial fibrillation or atrial flutter. Be sure you discuss which medicine you are taking to prevent stroke. Ask when you need to have your medicine levels checked. Also ask whether you may be able to stop taking it in the future or whether you need to take it for life. Some of these blood-thinning medicines such as warfarin will have the dose adjusted, and interact with other medicines or foods. Your healthcare team will give you full instructions on what to watch out for. Report bleeding or symptoms of stroke immediately to your healthcare team and seek emergency medical attention.  ??? Learn to take your own pulse. Keep a record of your results. Ask your healthcare provider when you should seek emergency medical attention. He or she will tell you which pulse rate reading is dangerous.???  ??? Cardioversion is usually short term (temporary). You may need it repeated if the abnormal heart rhythm returns. After the procedure, your healthcare provider will tell you if the treatment worked or if you will need further treatments or medicine.    Follow-up care  Make a follow-up appointment, or as advised.  When to call your healthcare provider  Call 911???right away if you have:  ??? Chest pain  ??? Shortness of breath  ??? Loss of vision, speech, or strength or coordination in any body part  Call your healthcare provider right away if you:  ??? Feel faint,???dizzy, or lightheaded  ??? Have chest pain with increased activity  ??? Have irregular heartbeat or fast pulse  Have bleeding issues from blood-thinning medicines          Instructions Given To: pt and wife, Bonita Quin    Instructions Given By: Vonzell Schlatter, RN

## 2019-02-11 ENCOUNTER — Encounter: Admit: 2019-02-11 | Discharge: 2019-02-11

## 2019-02-11 NOTE — Telephone Encounter
02/11/2019 9:48 AM   LMOM for a return call to schedule a 1 month post cardioversion follow up with Dr Virgina Organ in our [place] office. Patient will also need a 1-2 week nurse visit for wafarin start and teaching due to the expense of eliquis.  Wilder Glade, LPN

## 2019-02-12 ENCOUNTER — Encounter: Admit: 2019-02-12 | Discharge: 2019-02-12

## 2019-02-12 DIAGNOSIS — I4891 Unspecified atrial fibrillation: Secondary | ICD-10-CM

## 2019-02-13 ENCOUNTER — Encounter: Admit: 2019-02-13 | Discharge: 2019-02-13

## 2019-02-13 NOTE — Telephone Encounter
-----   Message from Wilder Glade, LPN sent at 11/16/9561 10:03 AM CDT -----  Regarding: FW: 1-2 week warfarin start  This is an [place] patient, I have atempted to reach him last Friday and today. I did leave a message for a call back today. Thanks Fredderick Phenix  ----- Message -----  From: Wilder Glade, LPN  Sent: [phone]/75/[phone]  To: C[facility] Nurse [place]  Subject: 1-2 week warfarin start                          Dr Virgina Organ would like the patient to have nurse visit in 1-2 weeks to change eliquis to warfarin. Patient also needs a 1 month follow up with Dr Virgina Organ. Attempted to reach patient, no answer. Lattie Haw b

## 2019-02-13 NOTE — Telephone Encounter
Called pt and left VM X2 to discuss switching medication and discuss warfarin teaching.      Reached out to B. Aggie Cosier, Therapist, sports.  Pt is scheduled to see LDB next week for tachycardia.  They will discuss warfarin teaching with patient at office visit.  If patient is agreeable to switching to warfarin, [facility] RNs will monitor INR.    Flag sent to [facility] RNs to follow up on anticoagulation following EP OV.

## 2019-02-14 NOTE — Progress Notes
Request for the following medical records for purpose of continuity of care:   Has an appointment with [facility] on 02/19/19    Please send most recent labs.      Please Fax to:  [facility] - [phone]  Dr. Artist Beach  Attention:  Barbie Haggis, RN    Thank you

## 2019-02-14 NOTE — Telephone Encounter
Received call from patients wife. They are really hesitant about switching to warfarin and would rather wait and discuss at [place] next week as planned.

## 2019-02-19 ENCOUNTER — Encounter: Admit: 2019-02-19 | Discharge: 2019-02-19

## 2019-02-19 DIAGNOSIS — J302 Other seasonal allergic rhinitis: Secondary | ICD-10-CM

## 2019-02-19 DIAGNOSIS — Z720 Tobacco use: Secondary | ICD-10-CM

## 2019-02-19 DIAGNOSIS — C61 Malignant neoplasm of prostate: Secondary | ICD-10-CM

## 2019-02-19 DIAGNOSIS — Z6841 Body Mass Index (BMI) 40.0 and over, adult: Secondary | ICD-10-CM

## 2019-02-19 DIAGNOSIS — M1990 Unspecified osteoarthritis, unspecified site: Secondary | ICD-10-CM

## 2019-02-19 DIAGNOSIS — I10 Essential (primary) hypertension: Secondary | ICD-10-CM

## 2019-02-19 DIAGNOSIS — G510 Bell's palsy: Secondary | ICD-10-CM

## 2019-02-19 DIAGNOSIS — I499 Cardiac arrhythmia, unspecified: Secondary | ICD-10-CM

## 2019-02-19 DIAGNOSIS — I4819 Other persistent atrial fibrillation: Principal | ICD-10-CM

## 2019-02-19 DIAGNOSIS — E669 Obesity, unspecified: Secondary | ICD-10-CM

## 2019-02-19 DIAGNOSIS — E119 Type 2 diabetes mellitus without complications: Secondary | ICD-10-CM

## 2019-02-19 DIAGNOSIS — H1851 Endothelial corneal dystrophy: Secondary | ICD-10-CM

## 2019-02-19 MED ORDER — SPIRONOLACTONE 50 MG PO TAB
50 mg | ORAL_TABLET | Freq: Every day | ORAL | 3 refills | 90.00000 days | Status: DC
Start: 2019-02-19 — End: 2019-08-08

## 2019-02-19 MED ORDER — RIVAROXABAN 20 MG PO TAB
20 mg | ORAL_TABLET | Freq: Every day | ORAL | 11 refills | 30.00000 days | Status: AC
Start: 2019-02-19 — End: ?

## 2019-02-19 MED ORDER — DILTIAZEM HCL 180 MG PO CS24
180 mg | ORAL_CAPSULE | Freq: Every day | ORAL | 11 refills | 45.00000 days | Status: DC
Start: 2019-02-19 — End: 2019-05-09

## 2019-02-19 MED ORDER — FUROSEMIDE 40 MG PO TAB
40 mg | ORAL_TABLET | Freq: Every morning | ORAL | 3 refills | 90.00000 days | Status: DC
Start: 2019-02-19 — End: 2019-12-02

## 2019-02-19 MED ORDER — CARVEDILOL 25 MG PO TAB
25 mg | ORAL_TABLET | Freq: Two times a day (BID) | ORAL | 0 refills | 90.00000 days | Status: DC
Start: 2019-02-19 — End: 2019-03-14

## 2019-02-19 NOTE — Progress Notes
Date of Service: 02/19/2019    Tristan Rosales is a 63 y.o. male.       HPI     Tristan Rosales was seen in EP consultation today.  He normally follows with Dr. Radford Pax.    Tristan Rosales is 38.  He is a retired Runner, broadcasting/film/video.    He actually saw [facility] back in 2006, not for arrhythmia but for an abnormal preop ECG.  He had a dobutamine stress echo before total knee replacement.  That study was (except mild concentric LVH).  There were no arrhythmias.  Dr. Buford Dresser had suggested that he had atrial fibrillation at that time but we actually have the documentation in our chart and again no arrhythmia    He has been morbidly obese all his adult life    He has sleep apnea and is on CPAP.  He did an overnight oximetry test last night at home.  When he woke up at midnight he noted an oxygen saturation of 66%.  He is convinced of that some work needs to be done on his sleep apnea and I would concur.    In 2019 he had a regadenoson thallium study that showed normal LV function.  The LV was thought to be mildly dilated.  He was nonischemic.  He also had an echo which was read as severe mid septal hypertrophy.  I have looked at the echo.  I would consider this concentric LVH.  I do not think he has HCM.    His EF last year was normal both by echo and thallium    He presented with Dr. Radford Pax a month ago.  From his perspective he was asymptomatic.  She noted him to be in A. fib.  She started Xarelto had approximately 18 days later did a TEE guided cardioversion.  He converted to sinus rhythm.  However 2 days later he noted that his heart rate was increased again (he started checking it) and indeed he is back in A. fib.  He did not feel any better the 2 days he was in sinus rhythm    His TEE suggested an EF of 40%.  I reviewed the study myself.  His heart rate was elevated, 110-120 bpm.  I am inclined to think his EF is actually still normal.    His resting heart rate today is 95 on his EKG and it was more like 105 when I examined him    He did increase his carvedilol to 1220 5 in the morning and 12 point 5 at night but his heart rate remains elevated.    He has significant issues with lower extremity edema and venous stasis.  He says his swelling is better since he got started on hydrochlorothiazide 50 instead of 25.  He is not on any potassium supplements.  When he came in for his TEE cardioversion his potassium was only 3.2.  He got some oral potassium that day but he was not sent home on any oral potassium so I would think he is hypokalemic again.    As I speak with him he is not had any palpitation but about 6 months ago he feels like he just ran out of energy and has not been able to do as much.  That may have just been winter blues but I suspect that that correlates with onset of A. fib.    He is on Eliquis which is costing him $128 per month.    There is no  PND or orthopnea.  He has dyspnea on exertion and fatigue.  There is no angina he is unaware of palpitation         Vitals:    02/19/19 0746   BP: 124/68   BP Source: Arm, Left Upper   Pulse: 93   Temp: 36.8 ???C (98.2 ???F)   SpO2: 94%   Weight: (!) 162 kg (357 lb 3.2 oz)   Height: 1.93 m (6' 4)   PainSc: Zero     Body mass index is 43.48 kg/m???.     Past Medical History  Patient Active Problem List    Diagnosis Date Noted   ??? Obstructive sleep apnea 02/19/2019   ??? Persistent atrial fibrillation (HCC) 01/21/2019     02/08/2019 - TEE / DCCV:  Successful DC cardioversion of Atrial fibrillation to sinus rhythm.  2 d later AF, not feeling any different those 2 days     ??? Tobacco abuse 11/23/2017   ??? Essential hypertension 10/19/2012   ??? Osteoarthritis 10/19/2012   ??? Morbid obesity (HCC) 10/19/2012   ??? Diabetes mellitus (HCC) 10/19/2012   ??? Prostate cancer (HCC) 10/02/2012     TRUS-Bx, by Dr. [name] 08/17/2012 3+4 in 2 cores on L, PSA 6.97, negative CT             Review of Systems   Constitution: Positive for malaise/fatigue.   HENT: Negative.    Eyes: Negative. Cardiovascular: Positive for dyspnea on exertion.   Respiratory: Positive for shortness of breath.    Endocrine: Negative.    Hematologic/Lymphatic: Negative.    Skin: Negative.    Musculoskeletal: Negative.    Gastrointestinal: Negative.    Genitourinary: Negative.    Neurological: Negative.    Psychiatric/Behavioral: Negative.    Allergic/Immunologic: Negative.    All other systems reviewed and are negative.      Physical Exam  General Appearance: resting comfortably, no acute distress, obese  Skin: warm, moist, no ulcers or xanthomas  Digits and Nails: no clubbing  Eyes: conjunctivae and lids normal, pupils are equal and round  Lips & Oral Mucosa: no pallor or cyanosis  Ear, Nose, Throat: No deformities, posterior oral pharynx clear  Neck Veins: neck veins are flat, neck veins are not distended  Thyroid: no nodules, masses, tenderness or enlargement  Chest Inspection: chest is normal in appearance  Respiratory Effort: breathing is unlabored, no respiratory distress  Auscultation/Percussion: lungs clear to auscultation, no rales, rhonchi, or wheezing  PMI: PMI not enlarged or displaced  Cardiac Auscultation: Normal S1 & S2, no S3 or S4, no rub  Murmurs: no cardiac murmur   Carotid Arteries: normal carotid upstroke bilaterally, no bruits  Pedal Pulses: normal symmetric pedal pulses  Lower Extremity Edema: 2+ brawny lower extremity edema, better on increased Hctz  Abdominal Exam: soft, non-tender, no masses, bowel sounds normal  Abdominal Aorta: nonpalpable abdominal aorta; no abdominal bruits  Liver & Spleen: no organomegaly  Muscle Strength: normal strength and tone  Neurologic Exam: normal balance and gait  Orientation: oriented to time, place and person  Affect & Mood: appropriate and sustained affect  Other: moves all extremities      Cardiovascular Studies  Today's EKG is AF at 95 bpm.  He has nonspecific T wave changes which are minor.    Problems Addressed Today  Encounter Diagnoses   Name Primary? ??? Persistent atrial fibrillation (HCC) Yes   ??? Morbid obesity (HCC)    ??? Obstructive sleep apnea  Assessment and Plan     He has persistent A. fib which recurred 2 days post cardioversion, please by history.    We need to check his thyroid status.    We need to fix his potassium    He remains fluid overloaded    His sleep apnea apparently needs more aggressive management    The question is whether we pursue a rate control strategy or a rhythm control strategy.  He and his wife would like to keep things simple.  Given his weight and his severe sleep apnea maintaining normal rhythm will be difficult.  He does have moderate left atrial dilatation.  He does not have significant valvular heart disease    I would be much more inclined to pursue rhythm control if he indeed has a diminished EF related to A. fib.    I am making several medication changes.    I am increasing carvedilol to 25 twice daily    I am stopping nifedipine which tends to promote edema and does nothing for the AV node.  I am starting him on a relatively small dose of diltiazem CD, 180 mg daily.    I am changing hydrochlorothiazide to furosemide because I think there is a good chance we will try dofetilide and him and dofetilide is not compatible with HCTZ.    I am beginning spironolactone    In 2 weeks we will check his TSH and a BMP, do a limited echo and I will see him in the office to assess his rates and to see how he is feeling.    We are going to do a price check on Xarelto to see if it might be cheaper than Eliquis    We are going to do a price check on dofetilide    Flecainide might also be an option for him    He needs his CPAP adjusted and his sleep apnea treated more effectively based on what he tells me    He did have radiation therapy for prostate cancer 5 years ago.  He tells me that he remains in remission         Current Medications (including today's revisions) ??? acetaminophen (TYLENOL) 325 mg tablet Take 500 mg by mouth three times daily.   ??? apixaban (ELIQUIS) 5 mg tablet Take one tablet by mouth twice daily.   ??? aspirin 81 mg chewable tablet Take 81 mg by mouth daily.   ??? carvediloL (COREG) 25 mg tablet Take one tablet by mouth twice daily.   ??? diclofenac sodium DR (VOLTAREN) 75 mg tablet Take 1 tablet by mouth twice daily.   ??? dilTIAZem XC (TIAZAC) 180 mg capsule Take one capsule by mouth daily.   ??? DOCOSAHEXANOIC ACID/EPA (FISH OIL PO) Take  by mouth.   ??? fluticasone propionate (FLONASE) 50 mcg/actuation nasal spray, suspension Apply 2 sprays to each nostril as directed daily. Shake bottle gently before using.   ??? furosemide (LASIX) 40 mg tablet Take one tablet by mouth every morning. Water pill   ??? GLUCOSAM-CHONDRO-HERB 149-HYAL (GLUCOS CHOND CPLX ADVANCED PO) Take  by mouth.   ??? HYDROcodone/acetaminophen (NORCO) 7.5/325 mg tablet Take 1 tablet by mouth four times daily as needed   ??? lisinopriL (ZESTRIL) 40 mg tablet Take 40 mg by mouth daily.   ??? loratadine (CLARITIN) 10 mg tablet Take 10 mg by mouth daily.   ??? metformin-ER(+) (FORTAMET) 1,000 mg tablet Take 1,000 mg by mouth twice daily.   ??? MULTIVITAMIN W-MINERALS/LUTEIN (CENTRUM  SILVER PO) Take  by mouth.   ??? omeprazole DR (PRILOSEC) 40 mg capsule Take 40 mg by mouth daily.   ??? prednisolone acetate (PRED FORTE) 1 % ophthalmic suspension twice daily.   ??? spironolactone (ALDACTONE) 50 mg tablet Take one tablet by mouth daily. Take with food.   ??? traMADol (ULTRAM) 50 mg tablet Take 100 mg by mouth four times daily.   ??? triamcinolone acetonide (KENALOG) 0.1 % topical cream twice daily as needed.   ??? trolamine salicylate (ASPERCREME TP) Apply  topically to affected area as Needed.

## 2019-02-19 NOTE — Telephone Encounter
Called pt insurance at [phone]. Provided Member ID: [id number]. Inquired about medication pricing on Xarelto. For Xarelto 20mg  (dosing confirmed with [facility]DB). Pricing for 30 day supply: $47, 90 day supply: $141. Reference #: [id number]    Attempted to call pt to review above pricing with him. Left detailed message requesting call back. If pt is agreeable, will change eliquis to xarelto.

## 2019-02-19 NOTE — Progress Notes
Meerdink, Annell Greening, Matin Mattioli, RN            Tikosyn 500MCG BID   30 Day: $ Prior Auth   90 Day: $ Prior Auth       Dofetilide 500MCG BID   30 Day: $177.24 Retail Pharmacy   90 Day: $441.28 Mail Order Pharmacy      Tikosyn/dofetilide pricing checked as pt may need admission for this in the future.

## 2019-02-19 NOTE — Telephone Encounter
Tristan Meckel, LPN  P Cvm Nurse Ep            VM from patient on triage line returning our call.   He said YES, he would like to go with the Xarelto.   [name] you very much for all your help.      Returned call to pt, he prefers 30 day supply. Xarelto ordered. Eliquis discontinued.

## 2019-02-20 ENCOUNTER — Ambulatory Visit: Admit: 2019-02-19 | Discharge: 2019-02-20

## 2019-02-20 DIAGNOSIS — I4891 Unspecified atrial fibrillation: Secondary | ICD-10-CM

## 2019-02-20 DIAGNOSIS — G4733 Obstructive sleep apnea (adult) (pediatric): Secondary | ICD-10-CM

## 2019-03-14 ENCOUNTER — Ambulatory Visit: Admit: 2019-03-14 | Discharge: 2019-03-14

## 2019-03-14 ENCOUNTER — Encounter: Admit: 2019-03-14 | Discharge: 2019-03-14

## 2019-03-14 ENCOUNTER — Ambulatory Visit: Admit: 2019-03-14 | Discharge: 2019-03-15

## 2019-03-14 DIAGNOSIS — G510 Bell's palsy: Secondary | ICD-10-CM

## 2019-03-14 DIAGNOSIS — I10 Essential (primary) hypertension: Secondary | ICD-10-CM

## 2019-03-14 DIAGNOSIS — M1990 Unspecified osteoarthritis, unspecified site: Secondary | ICD-10-CM

## 2019-03-14 DIAGNOSIS — E119 Type 2 diabetes mellitus without complications: Secondary | ICD-10-CM

## 2019-03-14 DIAGNOSIS — Z720 Tobacco use: Secondary | ICD-10-CM

## 2019-03-14 DIAGNOSIS — H1851 Endothelial corneal dystrophy: Secondary | ICD-10-CM

## 2019-03-14 DIAGNOSIS — J302 Other seasonal allergic rhinitis: Secondary | ICD-10-CM

## 2019-03-14 DIAGNOSIS — G4733 Obstructive sleep apnea (adult) (pediatric): Secondary | ICD-10-CM

## 2019-03-14 DIAGNOSIS — I499 Cardiac arrhythmia, unspecified: Secondary | ICD-10-CM

## 2019-03-14 DIAGNOSIS — I4819 Other persistent atrial fibrillation: Principal | ICD-10-CM

## 2019-03-14 DIAGNOSIS — E669 Obesity, unspecified: Secondary | ICD-10-CM

## 2019-03-14 DIAGNOSIS — C61 Malignant neoplasm of prostate: Secondary | ICD-10-CM

## 2019-03-14 LAB — BASIC METABOLIC PANEL
Lab: 107 MMOL/L (ref 98–110)
Lab: 142 MMOL/L (ref 137–147)
Lab: 24 MMOL/L (ref 21–30)
Lab: 4.6 MMOL/L (ref 3.5–5.1)
Lab: 11 (ref 3–12)

## 2019-03-14 LAB — CBC
Lab: 13 % (ref 11–15)
Lab: 13 g/dL — ABNORMAL LOW (ref 13.5–16.5)
Lab: 259 10*3/uL (ref 150–400)
Lab: 31 pg — ABNORMAL LOW (ref >60)
Lab: 32 g/dL (ref 32.0–36.0)
Lab: 39 % — ABNORMAL LOW (ref 40–50)
Lab: 4.1 M/UL — ABNORMAL LOW (ref 4.4–5.5)
Lab: 7.7 FL (ref 7–11)
Lab: 8.3 10*3/uL — ABNORMAL HIGH (ref 4.5–11.0)
Lab: 95 FL — ABNORMAL LOW (ref >60)

## 2019-03-14 LAB — TSH WITH FREE T4 REFLEX: Lab: 0.2 uU/mL — ABNORMAL LOW (ref 0.35–5.00)

## 2019-03-14 LAB — FREE T4-FREE THYROXINE: Lab: 1.3 ng/dL (ref 0.6–1.6)

## 2019-03-14 MED ORDER — PERFLUTREN LIPID MICROSPHERES 1.1 MG/ML IV SUSP
1-20 mL | Freq: Once | INTRAVENOUS | 0 refills | Status: CP | PRN
Start: 2019-03-14 — End: ?
  Administered 2019-03-14: 20:00:00 2 mL via INTRAVENOUS

## 2019-03-14 MED ORDER — CARVEDILOL 25 MG PO TAB
37.5 mg | ORAL_TABLET | Freq: Two times a day (BID) | ORAL | 3 refills | 90.00000 days | Status: DC
Start: 2019-03-14 — End: 2020-02-13

## 2019-03-14 NOTE — Telephone Encounter
Called lab. They have the specimen down there. Report to place spec in lab order for TSH. Order placed. Reports blood work will get processed.

## 2019-03-15 ENCOUNTER — Encounter: Admit: 2019-03-15 | Discharge: 2019-03-15

## 2019-05-09 ENCOUNTER — Encounter: Admit: 2019-05-09 | Discharge: 2019-05-09 | Payer: MEDICARE

## 2019-05-09 MED ORDER — DILTIAZEM HCL 180 MG PO CS24
180 mg | ORAL_CAPSULE | Freq: Every day | ORAL | 3 refills | 45.00000 days | Status: DC
Start: 2019-05-09 — End: 2019-08-08

## 2019-07-30 ENCOUNTER — Encounter: Admit: 2019-07-30 | Discharge: 2019-07-30 | Payer: MEDICARE

## 2019-07-30 ENCOUNTER — Ambulatory Visit: Admit: 2019-07-30 | Discharge: 2019-07-30 | Payer: MEDICARE

## 2019-07-30 DIAGNOSIS — I10 Essential (primary) hypertension: Secondary | ICD-10-CM

## 2019-07-30 DIAGNOSIS — R634 Abnormal weight loss: Secondary | ICD-10-CM

## 2019-07-30 DIAGNOSIS — C61 Malignant neoplasm of prostate: Secondary | ICD-10-CM

## 2019-07-30 DIAGNOSIS — E119 Type 2 diabetes mellitus without complications: Secondary | ICD-10-CM

## 2019-08-01 ENCOUNTER — Encounter: Admit: 2019-08-01 | Discharge: 2019-08-01 | Payer: MEDICARE

## 2019-08-01 DIAGNOSIS — E0590 Thyrotoxicosis, unspecified without thyrotoxic crisis or storm: Secondary | ICD-10-CM

## 2019-08-08 ENCOUNTER — Encounter: Admit: 2019-08-08 | Discharge: 2019-08-08 | Payer: MEDICARE

## 2019-08-08 ENCOUNTER — Ambulatory Visit: Admit: 2019-08-08 | Discharge: 2019-08-09 | Payer: MEDICARE

## 2019-08-08 DIAGNOSIS — M1990 Unspecified osteoarthritis, unspecified site: Secondary | ICD-10-CM

## 2019-08-08 DIAGNOSIS — E119 Type 2 diabetes mellitus without complications: Secondary | ICD-10-CM

## 2019-08-08 DIAGNOSIS — G510 Bell's palsy: Secondary | ICD-10-CM

## 2019-08-08 DIAGNOSIS — I10 Essential (primary) hypertension: Secondary | ICD-10-CM

## 2019-08-08 DIAGNOSIS — G4733 Obstructive sleep apnea (adult) (pediatric): Secondary | ICD-10-CM

## 2019-08-08 DIAGNOSIS — C61 Malignant neoplasm of prostate: Secondary | ICD-10-CM

## 2019-08-08 DIAGNOSIS — E669 Obesity, unspecified: Secondary | ICD-10-CM

## 2019-08-08 DIAGNOSIS — I4819 Other persistent atrial fibrillation: Secondary | ICD-10-CM

## 2019-08-08 DIAGNOSIS — I4821 Permanent atrial fibrillation: Secondary | ICD-10-CM

## 2019-08-08 DIAGNOSIS — J302 Other seasonal allergic rhinitis: Secondary | ICD-10-CM

## 2019-08-08 DIAGNOSIS — H18519 Fuchs' corneal dystrophy: Secondary | ICD-10-CM

## 2019-08-08 DIAGNOSIS — I499 Cardiac arrhythmia, unspecified: Secondary | ICD-10-CM

## 2019-08-08 DIAGNOSIS — Z720 Tobacco use: Secondary | ICD-10-CM

## 2019-08-08 NOTE — Progress Notes
Date of Service: 08/08/2019    Fabian Ros is a 64 y.o. male.       HPI     Mr. Munden was seen in your office few days ago, at that time he was hypotensive with a blood pressure in the range of 80/60 mmHg, the laboratory work was abnormal, it revealed a low TSH of 0.07 (normal range between 0.35?4.94 milliunits/mL), low hemoglobin and hematocrit, creatinine was elevated at 1.49 mg/dL.  The lisinopril was discontinued, patient was started on methimazole and he was scheduled to follow-up with me today in [place] office.    Patient's blood pressure and heart rate today are much better controlled.  He is currently on a combination of carvedilol, diltiazem, aspirin, he does take furosemide 40 mg p.o. daily.  The physical exam also reveals dry mucosas.    He has not had symptoms of chest pain, no heart palpitations no presyncope or syncope.  He did schedule an appointment with an endocrinologist.    Temporarily, patient and his wife live in [place], they take care of their granddaughter while their daughter is at work.    Mr. Imel does have a history of atrial fibrillation and he did undergo unsuccessful TEE guided cardioversion in July 2020.  Patient was seen by Dr. Clint Bolder in electrophysiology consultation, and it was recommended to continue rate control and anticoagulation with rivaroxaban.    Other comorbidities include morbid obesity with a BMI of 42.12, sleep apnea using CPAP machine, history of prostate cancer.    Recently performed 2D echocardiogram on July 30, 2019 demonstrated normal left ventricular systolic function, severe enlargement of the left atrium, the valvular structures were poorly visualized, however no significant abnormality appear to be present.         Vitals:    08/08/19 1602   BP: 118/78   BP Source: Arm, Left Upper   Patient Position: Sitting   Pulse: 95   Temp: 36.9 ?C (98.5 ?F)   TempSrc: Oral   Weight: (!) 156.9 kg (346 lb)   Height: 1.93 m (6' 4)   PainSc: Four Body mass index is 42.12 kg/m?[name]     Past Medical History  Patient Active Problem List    Diagnosis Date Noted   ? Obstructive sleep apnea 02/19/2019   ? Persistent atrial fibrillation (HCC) 01/21/2019     02/08/2019 - TEE / DCCV:  Successful DC cardioversion of Atrial fibrillation to sinus rhythm.  2 d later AF, not feeling any different those 2 days     ? Tobacco abuse 11/23/2017   ? Essential hypertension 10/19/2012   ? Osteoarthritis 10/19/2012   ? Morbid obesity (HCC) 10/19/2012   ? Type 2 diabetes mellitus without complication, without long-term current use of insulin (HCC) 10/19/2012   ? Prostate cancer (HCC) 10/02/2012     TRUS-Bx, by Dr. [name] 08/17/2012 3+4 in 2 cores on L, PSA 6.97, negative CT             Review of Systems   Constitution: Positive for malaise/fatigue.   HENT: Negative.    Eyes: Negative.    Cardiovascular: Negative.    Respiratory: Positive for cough.    Endocrine: Negative.    Hematologic/Lymphatic: Negative.    Skin: Negative.    Musculoskeletal: Positive for back pain and joint pain.   Gastrointestinal: Negative.    Genitourinary: Negative.    Neurological: Positive for light-headedness and weakness.   Psychiatric/Behavioral: Negative.    Allergic/Immunologic: Negative.  Physical Exam  General Appearance: normal in appearance  Skin: warm, moist, no ulcers or xanthomas  Eyes: conjunctivae and lids normal, pupils are equal and round  Lips & Oral Mucosa: no pallor or cyanosis  Neck Veins: neck veins are flat, neck veins are not distended  Chest Inspection: chest is normal in appearance  Respiratory Effort: breathing comfortably, no respiratory distress  Auscultation/Percussion: lungs clear to auscultation, no rales or rhonchi, no wheezing  Cardiac Rhythm: irregularly irregular rhythm and normal rate  Cardiac Auscultation: S1, S2 normal, no rub, no gallop  Murmurs: no murmur  Carotid Arteries: normal carotid upstroke bilaterally, no bruit  Abdominal Aorta: no abdominal aortic bruit Lower Extremity Edema: no lower extremity edema  Abdominal Exam: soft, non-tender, no masses, bowel sounds normal  Liver & Spleen: no organomegaly  Neurologic Exam: neurological assessment grossly intact         Cardiovascular Studies    Twelve-lead EKG demonstrates atrial fibrillation, ventricular rate is 95 bpm    Problems Addressed Today  Encounter Diagnoses   Name Primary?   ? Essential hypertension Yes   ? Persistent atrial fibrillation (HCC)    ? Permanent atrial fibrillation (HCC)    ? Prostate cancer (HCC)    ? Morbid obesity (HCC)    ? Type 2 diabetes mellitus without complication, without long-term current use of insulin (HCC)    ? Obstructive sleep apnea        Assessment and Plan     In summary: This is a 64 year old white male with a recent history of hypotension and hyper thyroidism, patient was initiated on methimazole, he also has a history of prostate cancer, permanent atrial fibrillation, he did fail cardioversion in the past, morbid obesity with a BMI of 42.12, OSA using CPAP machine and prostate cancer.    Plan:    1.  I did not make any changes in his medications  2.  I encouraged the patient to establish care with an endocrinologist  3.  I recommended to maintain a good state of hydration  4.  Follow-up office visit in 2 months         Current Medications (including today's revisions)  ? aspirin 81 mg chewable tablet Take 81 mg by mouth daily.   ? carvediloL (COREG) 25 mg tablet Take 1.5 tablets by mouth twice daily.   ? diclofenac sodium DR (VOLTAREN) 75 mg tablet Take 1 tablet by mouth twice daily.   ? dilTIAZem XC (TIAZAC) 180 mg capsule Take one capsule by mouth daily.   ? DOCOSAHEXANOIC ACID/EPA (FISH OIL PO) Take  by mouth.   ? fluticasone propionate (FLONASE) 50 mcg/actuation nasal spray, suspension Apply 2 sprays to each nostril as directed daily. Shake bottle gently before using. ? furosemide (LASIX) 40 mg tablet Take one tablet by mouth every morning. Water pill (Patient taking differently: Take 40 mg by mouth every 48 hours. Water pill)   ? GLUCOSAM-CHONDRO-HERB 149-HYAL (GLUCOS CHOND CPLX ADVANCED PO) Take  by mouth.   ? HYDROcodone/acetaminophen (NORCO) 7.5/325 mg tablet Take 1 tablet by mouth four times daily as needed   ? lisinopriL (ZESTRIL) 40 mg tablet Take 40 mg by mouth daily.   ? loratadine (CLARITIN) 10 mg tablet Take 10 mg by mouth daily.   ? metformin-ER(+) (FORTAMET) 1,000 mg tablet Take 500 mg by mouth twice daily.   ? methIMAzole (TAPAZOLE) 10 mg tablet Take 10 mg by mouth twice daily.   ? MULTIVITAMIN W-MINERALS/LUTEIN (CENTRUM SILVER PO) Take  by mouth.   ?  prednisolone acetate (PRED FORTE) 1 % ophthalmic suspension twice daily.   ? rivaroxaban (XARELTO) 20 mg tablet Take one tablet by mouth daily. Take with food.   ? spironolactone (ALDACTONE) 50 mg tablet Take one tablet by mouth daily. Take with food.   ? traMADol (ULTRAM) 50 mg tablet Take 100 mg by mouth four times daily.   ? triamcinolone acetonide (KENALOG) 0.1 % topical cream twice daily as needed.

## 2019-08-09 ENCOUNTER — Encounter: Admit: 2019-08-09 | Discharge: 2019-08-09 | Payer: MEDICARE

## 2019-12-02 ENCOUNTER — Encounter: Admit: 2019-12-02 | Discharge: 2019-12-02 | Payer: MEDICARE

## 2019-12-02 ENCOUNTER — Ambulatory Visit: Admit: 2019-12-02 | Discharge: 2019-12-02 | Payer: MEDICARE

## 2019-12-02 DIAGNOSIS — I10 Essential (primary) hypertension: Secondary | ICD-10-CM

## 2019-12-02 DIAGNOSIS — Z23 Encounter for immunization: Secondary | ICD-10-CM

## 2019-12-02 DIAGNOSIS — I4821 Permanent atrial fibrillation: Secondary | ICD-10-CM

## 2019-12-02 DIAGNOSIS — G4733 Obstructive sleep apnea (adult) (pediatric): Secondary | ICD-10-CM

## 2019-12-02 DIAGNOSIS — C61 Malignant neoplasm of prostate: Secondary | ICD-10-CM

## 2019-12-02 DIAGNOSIS — J302 Other seasonal allergic rhinitis: Secondary | ICD-10-CM

## 2019-12-02 DIAGNOSIS — I499 Cardiac arrhythmia, unspecified: Secondary | ICD-10-CM

## 2019-12-02 DIAGNOSIS — M1990 Unspecified osteoarthritis, unspecified site: Secondary | ICD-10-CM

## 2019-12-02 DIAGNOSIS — E119 Type 2 diabetes mellitus without complications: Secondary | ICD-10-CM

## 2019-12-02 DIAGNOSIS — I4819 Other persistent atrial fibrillation: Secondary | ICD-10-CM

## 2019-12-02 DIAGNOSIS — H18519 Fuchs' corneal dystrophy: Secondary | ICD-10-CM

## 2019-12-02 DIAGNOSIS — G510 Bell's palsy: Secondary | ICD-10-CM

## 2019-12-02 DIAGNOSIS — Z720 Tobacco use: Secondary | ICD-10-CM

## 2019-12-02 DIAGNOSIS — E669 Obesity, unspecified: Secondary | ICD-10-CM

## 2019-12-02 MED ORDER — FUROSEMIDE 40 MG PO TAB
40 mg | ORAL_TABLET | Freq: Two times a day (BID) | ORAL | 3 refills | 90.00000 days | Status: DC
Start: 2019-12-02 — End: 2020-01-02

## 2019-12-02 MED ORDER — POTASSIUM CHLORIDE 10 MEQ PO CPER
20 meq | ORAL_CAPSULE | Freq: Two times a day (BID) | ORAL | 3 refills | 30.00000 days | Status: AC
Start: 2019-12-02 — End: ?

## 2019-12-02 NOTE — Progress Notes
Date of Service: 12/02/2019    Tristan Rosales is a 64 y.o. male.       HPI     Tristan Rosales is a 64 year old white male that has a history of hypertension, hyperlipidemia, obstructive sleep apnea, obesity with a BMI of 43.58, permanent atrial fibrillation, he does not have any documented history of CAD.    Patient was last seen in January 2021 and at that time his medications were adjusted due to hypotension.  He also has hypothyroidism and has been successfully treated with methimazole.    From a cardiac standpoint this patient remains stable, he does not report chest pain, he has not experienced any episodes of symptomatic heart palpitations.    Currently his blood pressure is elevated, today with the record 140/98 mmHg in the left arm and 140/96 mmHg in the right arm with a heart rate of 78 bpm.  Patient takes furosemide 40 mg p.o. daily.         Vitals:    12/02/19 1010 12/02/19 1014   BP: (!) 140/98 (!) 140/96   BP Source: Arm, Left Upper Arm, Right Upper   Patient Position: Sitting Sitting   Pulse: 78    SpO2: 94%    Weight: (!) 162.4 kg (358 lb)    Height: 1.93 m (6' 4)    PainSc: Four      Body mass index is 43.58 kg/m?[name]     Past Medical History  Patient Active Problem List    Diagnosis Date Noted   ? Permanent atrial fibrillation (HCC) 08/08/2019   ? Obstructive sleep apnea 02/19/2019   ? Persistent atrial fibrillation (HCC) 01/21/2019     02/08/2019 - TEE / DCCV:  Successful DC cardioversion of Atrial fibrillation to sinus rhythm.  2 d later AF, not feeling any different those 2 days     ? Tobacco abuse 11/23/2017   ? Essential hypertension 10/19/2012   ? Osteoarthritis 10/19/2012   ? Morbid obesity (HCC) 10/19/2012   ? Type 2 diabetes mellitus without complication, without long-term current use of insulin (HCC) 10/19/2012   ? Prostate cancer (HCC) 10/02/2012     TRUS-Bx, by Tristan Rosales 08/17/2012 3+4 in 2 cores on L, PSA 6.97, negative CT             Review of Systems   Constitution: Negative.   HENT: Negative.    Eyes: Negative.    Cardiovascular: Negative.    Respiratory: Negative.    Endocrine: Negative.    Hematologic/Lymphatic: Negative.    Skin: Negative.    Musculoskeletal: Positive for back pain and neck pain.   Gastrointestinal: Negative.    Genitourinary: Negative.    Neurological: Positive for numbness.   Psychiatric/Behavioral: Negative.    Allergic/Immunologic: Negative.        Physical Exam  General Appearance: Morbidly obese  Skin: warm, moist, no ulcers or xanthomas  Eyes: conjunctivae and lids normal, pupils are equal and round  Lips & Oral Mucosa: no pallor or cyanosis  Neck Veins: neck veins are flat, neck veins are not distended  Chest Inspection: chest is normal in appearance  Respiratory Effort: breathing comfortably, no respiratory distress  Auscultation/Percussion: lungs clear to auscultation, no rales or rhonchi, no wheezing  Cardiac Rhythm: irregularly irregular rhythm and normal rate  Cardiac Auscultation: S1, S2 normal, no rub, no gallop  Murmurs: no murmur  Carotid Arteries: normal carotid upstroke bilaterally, no bruit  Abdominal Aorta: no abdominal aortic bruit  Lower Extremity Edema: no lower  extremity edema  Abdominal Exam: soft, non-tender, no masses, bowel sounds normal  Liver & Spleen: no organomegaly  Neurologic Exam: neurological assessment grossly intact         Cardiovascular Studies      Problems Addressed Today  Encounter Diagnoses   Name Primary?   ? Essential hypertension Yes   ? Persistent atrial fibrillation (HCC)    ? Permanent atrial fibrillation (HCC)    ? Prostate cancer (HCC)    ? Morbid obesity (HCC)    ? Type 2 diabetes mellitus without complication, without long-term current use of insulin (HCC)    ? Obstructive sleep apnea    ? COVID-19 vaccine administered        Assessment and Plan     In summary: This is a 64 year old white male with known permanent atrial fibrillation, his rate is well controlled and he is anticoagulated with rivaroxaban, patient presents with bilateral lower extremity edema, this could represent diastolic dysfunction in the setting of permanent atrial fibrillation as well as a side effect of the nondihydropyridine calcium channel blocker therapy.    Plan:    1.  Continue the current dose of diltiazem and carvedilol  2.  Increase furosemide to 40 mg p.o. twice daily  3.  Add Micro-K 20 mg p.o. twice daily  4.  Follow-up office visit in approximately a month with a nurse practitioner, patient will come back and see me in approximately 10 months.    Total time spent on today's office visit was  30 minutes. This includes face-to-face in person visit with patient as well as nonface-to-face time including review of the EMR, outside records, labs, radiologic studies, echocardiogram & other cardiovascular studies, formulation of treatment plan, after visit summary, future disposition, personal discussions with patient and family, and lastly on documentation.         Current Medications (including today's revisions)  ? aspirin 81 mg chewable tablet Take 81 mg by mouth daily.   ? carvediloL (COREG) 25 mg tablet Take 1.5 tablets by mouth twice daily.   ? dilTIAZem CD (CARDIZEM CD) 180 mg capsule Take 180 mg by mouth daily.   ? DOCOSAHEXANOIC ACID/EPA (FISH OIL PO) Take  by mouth.   ? fluticasone propionate (FLONASE) 50 mcg/actuation nasal spray, suspension Apply 2 sprays to each nostril as directed daily. Shake bottle gently before using.   ? furosemide (LASIX) 40 mg tablet Take one tablet by mouth every morning. Water pill (Patient taking differently: Take 40 mg by mouth daily. Water pill)   ? GLUCOSAM-CHONDRO-HERB 149-HYAL (GLUCOS CHOND CPLX ADVANCED PO) Take  by mouth.   ? HYDROcodone/acetaminophen (NORCO) 7.5/325 mg tablet Take 1 tablet by mouth four times daily as needed   ? loratadine (CLARITIN) 10 mg tablet Take 10 mg by mouth daily.   ? metformin-ER(+) (FORTAMET) 1,000 mg tablet Take 500 mg by mouth twice daily.   ? methIMAzole (TAPAZOLE) 10 mg tablet Take 5 mg by mouth daily.   ? MULTIVITAMIN W-MINERALS/LUTEIN (CENTRUM SILVER PO) Take  by mouth.   ? prednisolone acetate (PRED FORTE) 1 % ophthalmic suspension daily.   ? rivaroxaban (XARELTO) 20 mg tablet Take one tablet by mouth daily. Take with food.   ? triamcinolone acetonide (KENALOG) 0.1 % topical cream twice daily as needed.

## 2020-01-02 ENCOUNTER — Ambulatory Visit: Admit: 2020-01-02 | Discharge: 2020-01-03 | Payer: MEDICARE

## 2020-01-02 ENCOUNTER — Encounter: Admit: 2020-01-02 | Discharge: 2020-01-02 | Payer: MEDICARE

## 2020-01-02 DIAGNOSIS — E119 Type 2 diabetes mellitus without complications: Secondary | ICD-10-CM

## 2020-01-02 DIAGNOSIS — I10 Essential (primary) hypertension: Secondary | ICD-10-CM

## 2020-01-02 DIAGNOSIS — C61 Malignant neoplasm of prostate: Secondary | ICD-10-CM

## 2020-01-02 DIAGNOSIS — E669 Obesity, unspecified: Secondary | ICD-10-CM

## 2020-01-02 DIAGNOSIS — I4821 Permanent atrial fibrillation: Secondary | ICD-10-CM

## 2020-01-02 DIAGNOSIS — M1990 Unspecified osteoarthritis, unspecified site: Secondary | ICD-10-CM

## 2020-01-02 DIAGNOSIS — Z720 Tobacco use: Secondary | ICD-10-CM

## 2020-01-02 DIAGNOSIS — R600 Localized edema: Secondary | ICD-10-CM

## 2020-01-02 DIAGNOSIS — G510 Bell's palsy: Secondary | ICD-10-CM

## 2020-01-02 DIAGNOSIS — J302 Other seasonal allergic rhinitis: Secondary | ICD-10-CM

## 2020-01-02 DIAGNOSIS — I499 Cardiac arrhythmia, unspecified: Secondary | ICD-10-CM

## 2020-01-02 DIAGNOSIS — H18519 Fuchs' corneal dystrophy: Secondary | ICD-10-CM

## 2020-01-02 MED ORDER — FUROSEMIDE 40 MG PO TAB
ORAL_TABLET | ORAL | 3 refills | 90.00000 days | Status: AC
Start: 2020-01-02 — End: ?

## 2020-02-07 ENCOUNTER — Encounter: Admit: 2020-02-07 | Discharge: 2020-02-07 | Payer: MEDICARE

## 2020-02-07 NOTE — Telephone Encounter
Leanne called from [facility], phone [phone].  Patient having biopsy on Tuesday 7/20 x3 nodules. Asked to hold xarelto and asa. Contacted [facility] and she said he could hold xarelto for 72 hours, but would like him to continue ASA.     LMOM for [employer] [facility] with recommendations and also with patient

## 2020-02-11 ENCOUNTER — Encounter: Admit: 2020-02-11 | Discharge: 2020-02-11 | Payer: MEDICARE

## 2020-02-12 ENCOUNTER — Encounter: Admit: 2020-02-12 | Discharge: 2020-02-12 | Payer: MEDICARE

## 2020-02-12 NOTE — Telephone Encounter
Patient called r/g upcoming appointment tomorrow. He saw Fannie Knee last month and was suppose to have a BMP in a week. He states he handed them the order along with other orders and apparently the BMP didn't get drawn. He states that he hasn't been feeling really great and states he feels like it has in the past when his kidneys are functioning well. I faxed the order to [phone] and he will get drawn today

## 2020-02-13 ENCOUNTER — Ambulatory Visit: Admit: 2020-02-13 | Discharge: 2020-02-14 | Payer: MEDICARE

## 2020-02-13 ENCOUNTER — Encounter: Admit: 2020-02-13 | Discharge: 2020-02-13 | Payer: MEDICARE

## 2020-02-13 DIAGNOSIS — E119 Type 2 diabetes mellitus without complications: Secondary | ICD-10-CM

## 2020-02-13 DIAGNOSIS — I10 Essential (primary) hypertension: Secondary | ICD-10-CM

## 2020-02-13 DIAGNOSIS — E669 Obesity, unspecified: Secondary | ICD-10-CM

## 2020-02-13 DIAGNOSIS — R600 Localized edema: Secondary | ICD-10-CM

## 2020-02-13 DIAGNOSIS — I4819 Other persistent atrial fibrillation: Secondary | ICD-10-CM

## 2020-02-13 DIAGNOSIS — J302 Other seasonal allergic rhinitis: Secondary | ICD-10-CM

## 2020-02-13 DIAGNOSIS — G510 Bell's palsy: Secondary | ICD-10-CM

## 2020-02-13 DIAGNOSIS — Z720 Tobacco use: Secondary | ICD-10-CM

## 2020-02-13 DIAGNOSIS — C61 Malignant neoplasm of prostate: Secondary | ICD-10-CM

## 2020-02-13 DIAGNOSIS — I499 Cardiac arrhythmia, unspecified: Secondary | ICD-10-CM

## 2020-02-13 DIAGNOSIS — H18519 Fuchs' corneal dystrophy: Secondary | ICD-10-CM

## 2020-02-13 DIAGNOSIS — M1990 Unspecified osteoarthritis, unspecified site: Secondary | ICD-10-CM

## 2020-02-13 DIAGNOSIS — G4733 Obstructive sleep apnea (adult) (pediatric): Secondary | ICD-10-CM

## 2020-02-13 MED ORDER — CARVEDILOL 25 MG PO TAB
ORAL_TABLET | Freq: Two times a day (BID) | ORAL | 3 refills | 90.00000 days | Status: AC
Start: 2020-02-13 — End: ?

## 2020-02-13 MED ORDER — ROSUVASTATIN 10 MG PO TAB
10 mg | ORAL_TABLET | Freq: Every day | ORAL | 3 refills | 90.00000 days | Status: AC
Start: 2020-02-13 — End: ?

## 2020-02-13 NOTE — Progress Notes
Date of Service: 02/13/2020    Tristan Rosales is a 64 y.o. male.       HPI     ?  I had the pleasure of seeing Tristan Rosales today for follow-up of his hypertension and lower leg edema.  Tristan Rosales is a very pleasant 64 year old Caucasian gentleman who also has hyperlipidemia, obstructive sleep apnea, morbid obesity, permanent atrial fibrillation on anticoagulation rivaroxaban, hypothyroidism successfully treated with methimazole.      Tristan Rosales was last seen in our office on January 02, 2020 noting that he had tried taking furosemide 40 mg twice daily but was voiding so frequently that he stopped taking his afternoon dose.  He continued to have some mild lower leg edema.  He was working at a low-salt diet.  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  He was asked to switch his furosemide to 80 mg in the morning and to check a BMP in 1 week.    He presents to the office today along with his wife.  He tells me that switching the furosemide to 80 mg in the morning has worked well for him and he feels like his lower leg edema is almost gone.  We have not yet received his lab results but he tells me that he did have labs drawn at his endocrinologist office Tristan Rosales in June.  We have contacted their office for a courtesy copy of these labs.  Today he tells me that on June 20 he had a biopsy of his thyroid.  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  He has continued on methimazole.  He tells me that over the last week he has had some problems with abdominal cramping and some pain in his back.  He tells me he had similar pains in the past when he had kidney failure.  He does have some dyspnea on exertion and occasional cough.  He tells me he tends to have throat congestion and occasionally has some dark green to brown phlegm production.  He has been wearing his CPAP consistently at night.  He has been attending water aerobics 2 times a week and tells me he feels like that is helping him get a little stronger.    Assessment and plan    1.  Hypertension.  His blood pressure today is 144/88.  He has been asked to increase his carvedilol from 37.5 mg twice daily to 50 mg twice daily.  He has been encouraged to continue working hard at a low-salt diet.  He will follow-up in our office in about 8 weeks so we can recheck his blood pressure.  He has been given a lab order to check a BMP today.  We will call him when we have these results.    2.  Diabetes mellitus.  He is currently on Metformin    3.  Hyperlipidemia.  We discussed that with his diagnosis of diabetes mellitus he should be on a statin.  Today he has been asked to start rosuvastatin 10 mg daily.  He has been given a lab order to check a fasting lipid profile, AST, and ALT in 6 weeks and he will follow-up in our office a few weeks later for further evaluation of his lipid results.    4.  Lower leg edema.  He  has just scant lower leg edema.  He will continue on the same furosemide 80 mg each morning.    Thank you for the opportunity to participate in the care of this patient.  Please feel free to call Tristan Rosales if you have any questions or concerns    [name], APRN-C        _________________________________________________________________________________  DISEASE PREVENTION:   Lipids/Statin treatment: crestor  Lab Results   Component Value Date/Time    CHOL 132 07/24/2019 12:00 AM    TRIG 92 07/24/2019 12:00 AM    HDL 41 07/24/2019 12:00 AM    LDL 73 07/24/2019 12:00 AM    VLDL 18 07/24/2019 12:00 AM    CHOLHDLC 3 07/24/2019 12:00 AM     Goal LDL < 70, Triglycerides < 161 mg/dl.     HTN: not controlled. Goal Systolic < 130, diastolic < 90.   Not optimally controlled: meds adjusted    Diabetes:  Yes   Lab Results   Component Value Date/Time    HGBA1C 5.2 06/27/2017 12:00 AM    Follows with PCP for goal A1c < 7.0 Tobacco: denies.      Obesity/Nutrition/Physical Activity: Body mass index is 41.87 kg/m?Tristan Rosales Discussed exercise recommendation of 30 min most days of the week and diet management with emphasis on vegetables, fruit and lean meat.  ____________________________________________________________________________________                 Vitals:    02/13/20 1039   BP: (!) 144/88   BP Source: Arm, Left Upper   Patient Position: Sitting   Pulse: 96   Resp: 20   SpO2: 95%   Weight: (!) 156 kg (344 lb)   Height: 1.93 m (6' 4)   PainSc: Seven     Body mass index is 41.87 kg/m?Tristan Rosales     Past Medical History  Patient Active Problem List    Diagnosis Date Noted   ? Bilateral lower extremity edema 12/02/2019   ? COVID-19 vaccine administered 12/02/2019   ? COVID-19 vaccine administered 12/02/2019   ? Permanent atrial fibrillation (HCC) 08/08/2019   ? Obstructive sleep apnea 02/19/2019   ? Persistent atrial fibrillation (HCC) 01/21/2019     02/08/2019 - TEE / DCCV:  Successful DC cardioversion of Atrial fibrillation to sinus rhythm.  2 d later AF, not feeling any different those 2 days     ? Tobacco abuse 11/23/2017   ? Essential hypertension 10/19/2012   ? Osteoarthritis 10/19/2012   ? Morbid obesity (HCC) 10/19/2012   ? Type 2 diabetes mellitus without complication, without long-term current use of insulin (HCC) 10/19/2012   ? Prostate cancer (HCC) 10/02/2012     TRUS-Bx, by Dr. Larwance Rosales 08/17/2012 3+4 in 2 cores on L, PSA 6.97, negative CT             Review of Systems   Constitution: Negative.   HENT: Positive for congestion.    Eyes: Negative.    Cardiovascular: Positive for leg swelling.   Respiratory: Positive for sputum production.    Endocrine: Positive for cold intolerance.   Hematologic/Lymphatic: Negative.    Skin: Negative.    Musculoskeletal: Positive for back pain.   Gastrointestinal: Positive for abdominal pain and nausea.   Genitourinary: Negative.    Neurological: Positive for headaches.   Psychiatric/Behavioral: Negative. Allergic/Immunologic: Negative.    He denies having chest pain, palpitations, PND, orthopnea, lightheadedness, dizziness, pre-syncope, syncope,  wheezing, shortness of air, or hemoptysis.Tristan Rosales He uses no tobacco products.  Physical Exam    General Appearance: resting comfortably, no acute distress, face mask on  Skin: warm, moist  Eyes: conjunctivae and lids normal, pupils are equal and round  Ears: No deformities  Neck Veins: neck veins are flat, neck veins are not distended  Chest Inspection: chest is normal in appearance  Respiratory Effort: breathing is unlabored, no respiratory distress  Auscultation/Percussion: lungs clear to auscultation, no rales, rhonchi, or wheezing  PMI: PMI not enlarged or displaced  Cardiac Rhythm: regular rhythm and normal rate  Cardiac Auscultation: Normal S1 & S2, no S3 or S4, no rub  Murmurs: no cardiac murmurs   Carotid Arteries: normal carotid upstroke bilaterally, no bruits  Pedal Pulses: normal symmetric pedal pulses  Lower Extremity Edema: scant bilateral lower extremity edema  Abdominal Exam: soft, non-tender, no masses, bowel sounds normal  Abdominal Aorta: nonpalpable abdominal aorta; no abdominal bruits  Liver & Spleen: no organomegaly  Gait & Station: normal balance and gait  Muscle Strength: normal strength and tone  Neurologic Exam: neurological assessment grossly intact  Orientation: oriented to time, place and person  Affect & Mood: appropriate and sustained affect  Other: moves all extremities                    Current Medications (including today's revisions)  ? aspirin 81 mg chewable tablet Take 81 mg by mouth daily.   ? carvediloL (COREG) 25 mg tablet Take 1.5 tablets by mouth twice daily.   ? cyclobenzaprine (FLEXERIL) 10 mg tablet Take 10 mg by mouth three times daily as needed for Muscle Cramps.   ? cycloSPORINE (RESTASIS) 0.05 % ophthalmic emulsion Apply 1 drop to both eyes twice daily.   ? dilTIAZem CD (CARDIZEM CD) 180 mg capsule Take 180 mg by mouth daily. ? DOCOSAHEXANOIC ACID/EPA (FISH OIL PO) Take  by mouth.   ? fluticasone propionate (FLONASE) 50 mcg/actuation nasal spray, suspension Apply 2 sprays to each nostril as directed daily. Shake bottle gently before using.   ? furosemide (LASIX) 40 mg tablet Take 2 of the 40 mg tablets each am   ? GLUCOSAM-CHONDRO-HERB 149-HYAL (GLUCOS CHOND CPLX ADVANCED PO) Take  by mouth.   ? HYDROcodone/acetaminophen (NORCO) 7.5/325 mg tablet Take 1 tablet by mouth four times daily as needed   ? loratadine (CLARITIN) 10 mg tablet Take 10 mg by mouth daily.   ? metformin-ER(+) (FORTAMET) 1,000 mg tablet Take 500 mg by mouth twice daily.   ? methIMAzole (TAPAZOLE) 10 mg tablet Take 2.5 mg by mouth daily.   ? MULTIVITAMIN W-MINERALS/LUTEIN (CENTRUM SILVER PO) Take  by mouth.   ? potassium chloride (MICRO-K) 10 mEq capsule Take two capsules by mouth twice daily. Take with a meal and a full glass of water.   ? prednisolone acetate (PRED FORTE) 1 % ophthalmic suspension daily.   ? rivaroxaban (XARELTO) 20 mg tablet Take one tablet by mouth daily. Take with food.   ? triamcinolone acetonide (KENALOG) 0.1 % topical cream twice daily as needed.

## 2020-02-14 DIAGNOSIS — E782 Mixed hyperlipidemia: Secondary | ICD-10-CM

## 2020-02-17 ENCOUNTER — Encounter: Admit: 2020-02-17 | Discharge: 2020-02-17 | Payer: MEDICARE

## 2020-02-17 NOTE — Telephone Encounter
-----   Message from Humberto Seals, APRN-NP sent at 02/17/2020  9:31 AM CDT -----  Regarding: Labs  Labs acceptable after adjusting his lasix dose previously.  Continue same plan of care.  Charlena Cross  ----- Message -----  From: Eilleen Kempf, RN  Sent: 02/14/2020   7:42 AM CDT  To: Hettie Holstein Nordhues, APRN-NP    Pt seen here yesterday for OV. Was asked to get BMP. Please advise if any changes.

## 2020-03-08 ENCOUNTER — Encounter: Admit: 2020-03-08 | Discharge: 2020-03-08 | Payer: MEDICARE

## 2020-03-08 MED ORDER — XARELTO 20 MG PO TAB
ORAL_TABLET | Freq: Every day | 0 refills
Start: 2020-03-08 — End: ?

## 2020-03-09 ENCOUNTER — Encounter: Admit: 2020-03-09 | Discharge: 2020-03-09 | Payer: MEDICARE

## 2020-03-09 MED ORDER — DILTIAZEM HCL 180 MG PO CP24
180 mg | ORAL_CAPSULE | Freq: Every day | ORAL | 3 refills | 45.00000 days | Status: AC
Start: 2020-03-09 — End: ?

## 2020-04-02 ENCOUNTER — Encounter: Admit: 2020-04-02 | Discharge: 2020-04-02 | Payer: MEDICARE

## 2020-04-02 ENCOUNTER — Ambulatory Visit: Admit: 2020-04-02 | Discharge: 2020-04-03 | Payer: MEDICARE

## 2020-04-02 DIAGNOSIS — C61 Malignant neoplasm of prostate: Secondary | ICD-10-CM

## 2020-04-02 DIAGNOSIS — G510 Bell's palsy: Secondary | ICD-10-CM

## 2020-04-02 DIAGNOSIS — E119 Type 2 diabetes mellitus without complications: Secondary | ICD-10-CM

## 2020-04-02 DIAGNOSIS — I499 Cardiac arrhythmia, unspecified: Secondary | ICD-10-CM

## 2020-04-02 DIAGNOSIS — R600 Localized edema: Secondary | ICD-10-CM

## 2020-04-02 DIAGNOSIS — E669 Obesity, unspecified: Secondary | ICD-10-CM

## 2020-04-02 DIAGNOSIS — G4733 Obstructive sleep apnea (adult) (pediatric): Secondary | ICD-10-CM

## 2020-04-02 DIAGNOSIS — E782 Mixed hyperlipidemia: Secondary | ICD-10-CM

## 2020-04-02 DIAGNOSIS — I4821 Permanent atrial fibrillation: Secondary | ICD-10-CM

## 2020-04-02 DIAGNOSIS — I10 Essential (primary) hypertension: Secondary | ICD-10-CM

## 2020-04-02 DIAGNOSIS — H18519 Fuchs' corneal dystrophy: Secondary | ICD-10-CM

## 2020-04-02 DIAGNOSIS — J302 Other seasonal allergic rhinitis: Secondary | ICD-10-CM

## 2020-04-02 DIAGNOSIS — Z720 Tobacco use: Secondary | ICD-10-CM

## 2020-04-02 DIAGNOSIS — M1990 Unspecified osteoarthritis, unspecified site: Secondary | ICD-10-CM

## 2020-04-02 MED ORDER — POTASSIUM CHLORIDE 10 MEQ PO CPER
10 meq | ORAL_CAPSULE | Freq: Two times a day (BID) | ORAL | 3 refills | 30.00000 days | Status: AC
Start: 2020-04-02 — End: ?

## 2020-04-02 MED ORDER — ROSUVASTATIN 5 MG PO TAB
5 mg | ORAL_TABLET | Freq: Every day | ORAL | 3 refills | 90.00000 days | Status: AC
Start: 2020-04-02 — End: ?

## 2020-04-02 MED ORDER — SPIRONOLACTONE 25 MG PO TAB
ORAL_TABLET | ORAL | 3 refills | 90.00000 days | Status: AC
Start: 2020-04-02 — End: ?

## 2020-04-02 NOTE — Progress Notes
Date of Service: 04/02/2020    Tristan Rosales is a 64 y.o. male.       HPI       ?  I had the pleasure of seeing Tristan Rosales today for follow-up of his hypertension and lower leg edema. ?Tristan Rosales is a very pleasant 64 year old Caucasian gentleman who also has hyperlipidemia, obstructive sleep apnea, morbid obesity, permanent atrial fibrillation on anticoagulation rivaroxaban, hypothyroidism successfully treated with methimazole. ? He also has diabetes mellitus and previously had been on lisinopril and spironolactone and his creatinine slightly elevated and both of those medications were discontinued and his creatinine improved.  ?  [name] followed up in our office on January 02, 2020 noting that he had tried taking furosemide 40 mg twice daily but was voiding so frequently that he stopped taking his afternoon dose.  He continued to have some mild lower leg edema.  He was working at a low-salt diet.  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  He was asked to switch his furosemide to 80 mg in the morning and to check a BMP in 1 week.    He was last seen in our office February 13, 2020.  That day his blood pressure was elevated at 104/88.  His lower leg edema was almost gone.  He was asked to increase his carvedilol from 37.5 mg twice daily to 50 mg twice daily.  He was asked to work hard at his low-salt diet.  He was asked to check a BMP that day.  Due to his diagnosis of diabetes mellitus he was asked to begin rosuvastatin 10 mg daily and to check fasting labs in about 6 weeks.    We did receive his labs drawn February 13, 2020 revealing a sodium 143, potassium 4.2, chloride 102, CO2 33, BUN 22, and creatinine 1.38.    Tristan Rosales presents to the office today telling me that he recently had a left ear infection and has completed his antibiotics.  He tells me he does have an occasional cough that produces small amounts of tan phlegm.  He is wearing his CPAP consistently at night to treat his sleep apnea.  We discussed that previously he had been on lisinopril and spironolactone for blood pressure control.  His blood pressure had gotten rather low on that dose and his creatinine had elevated.  Both of them were discontinued and his creatinine improved as did his blood pressure.  He tells me that his blood sugars have been well controlled.    We reviewed his cholesterol labs drawn March 31, 2020 revealing a total cholesterol 106, triglycerides 63, HDL 51, and LDL 41.  He tells me he has tolerated the rosuvastatin 10 mg daily without any problems.  His 1 complaint today is he feels like his lower leg edema is maybe a little worse.  He tells me he is taking his furosemide 80 mg each morning consistently and working at a low-salt diet.    Assessment and plan    1.  Hypertension.  Today he has been asked to add back spironolactone 25 mg 1/2 tablet each morning.  He will decrease his potassium 10 mEq to just 1 tablet twice daily.  He has been given a lab order to check a BMP in 2 weeks.  We will keep an eye on his potassium and creatinine levels.  He will continue  to work hard at a low-salt diet.    2.  Hyperlipidemia.  His LDL is lower than needed at 41.  He has been asked to decrease his rosuvastatin dose to just 5 mg daily.  He has been given a lab order to check a fasting lipid profile, AST, and ALTs 3 months.  We will call these results to him.    3.  Diabetes mellitus.  He tells me his blood sugars are well controlled and he continues on Metformin.    4.  Lower leg edema.  He continues to have lower leg edema that he feels this may be a little worse.  He has been asked to add back spironolactone 25 mg 1/2 tablet daily.  He will recheck a BMP in 2 weeks.  He has been encouraged to continue working hard at a low-salt diet.  Hopefully his kidney function will allow for him to stay on this low dose spironolactone.    5. History of permanent atrial fibrillation.  He continues on carvedilol for rate control and Xarelto 20 mg daily for anticoagulation.  He denies any signs of bleeding.    He will plan to follow-up in our office for further evaluation in 2 months.    Thank you for the opportunity to participate in the care of this patient.  Please feel free to call us if you have any questions or concerns    [name], APRN-C          Cardiovascular Health Factors  Vitals BP Readings from Last 3 Encounters:   04/02/20 (!) 142/80   02/13/20 (!) 144/88   01/02/20 (!) 136/90     Wt Readings from Last 3 Encounters:   04/02/20 (!) 158.1 kg (348 lb 9.6 oz)   02/13/20 (!) 156 kg (344 lb)   01/02/20 (!) 165.1 kg (364 lb)     BMI Readings from Last 3 Encounters:   04/02/20 42.43 kg/m?   02/13/20 41.87 kg/m?   01/02/20 44.31 kg/m?      Smoking Social History     Tobacco Use   Smoking Status Former Smoker   ? Packs/day: 0.30   ? Years: 24.00   ? Pack years: 7.20   ? Types: Cigarettes   ? Quit date: 06/26/2017   ? Years since quitting: 2.7   Smokeless Tobacco Never Used      Lipid Profile Cholesterol   Date Value Ref Range Status   03/31/2020 106 <200 mg/dL Final     HDL   Date Value Ref Range Status   03/31/2020 51 > OR = 40 mg/dL Final     LDL   Date Value Ref Range Status   03/31/2020 41 mg/dL (calc) Final     Comment:     Reference range: <100     Desirable range <100 mg/dL for primary prevention;    <70 mg/dL for patients with CHD or diabetic patients   with > or = 2 CHD risk factors.     LDL-C is now calculated using the Martin-Hopkins   calculation, which is a validated novel method providing   better accuracy than the Friedewald equation in the   estimation of LDL-C.   Horald Pollen et al. Lenox Ahr. 1610;960(45): 2061-2068   (http://education.[facility].com/faq/FAQ164)       Triglycerides   Date Value Ref Range Status   03/31/2020 63 <150 mg/dL Final      Blood Sugar Hemoglobin A1C   Date Value Ref Range Status   06/27/2017 5.2  Final Glucose   Date Value Ref Range Status   02/13/2020 106 65 - 139 mg/dL Final     Comment:               Non-fasting reference interval        07/31/2019 96  Final   03/14/2019 116 (H) 70 - 100 MG/DL Final   16/04/9603 89  Final     Glucose, POC   Date Value Ref Range Status   02/08/2019 89 70 - 100 MG/DL Final   54/03/8118 98 70 - 100 MG/DL Final                 Vitals:    04/02/20 1113   BP: (!) 154/82   BP Source: Arm, Left Upper   Patient Position: Sitting   Pulse: 100   Resp: 18   SpO2: 96%   Weight: (!) 158.1 kg (348 lb 9.6 oz)   Height: 1.93 m (6' 4)   PainSc: Four     Body mass index is 42.43 kg/m?[name]     Past Medical History  Patient Active Problem List    Diagnosis Date Noted   ? Bilateral lower extremity edema 12/02/2019   ? COVID-19 vaccine administered 12/02/2019   ? COVID-19 vaccine administered 12/02/2019   ? Permanent atrial fibrillation (HCC) 08/08/2019   ? Obstructive sleep apnea 02/19/2019   ? Persistent atrial fibrillation (HCC) 01/21/2019     02/08/2019 - TEE / DCCV:  Successful DC cardioversion of Atrial fibrillation to sinus rhythm.  2 d later AF, not feeling any different those 2 days     ? Tobacco abuse 11/23/2017   ? Essential hypertension 10/19/2012   ? Osteoarthritis 10/19/2012   ? Morbid obesity (HCC) 10/19/2012   ? Type 2 diabetes mellitus without complication, without long-term current use of insulin (HCC) 10/19/2012   ? Prostate cancer (HCC) 10/02/2012     TRUS-Bx, by Dr. [name] 08/17/2012 3+4 in 2 cores on L, PSA 6.97, negative CT             Review of Systems   Constitutional: Negative.   HENT: Negative.    Eyes: Negative.    Cardiovascular: Positive for leg swelling.   Respiratory: Negative.    Endocrine: Negative.    Hematologic/Lymphatic: Negative.    Skin: Negative.  Rash:      Musculoskeletal: Negative.    Gastrointestinal: Negative.    Genitourinary: Negative.    Neurological: Negative.    Psychiatric/Behavioral: Negative.    Allergic/Immunologic: Negative.    He denies having chest pain, palpitations, PND, orthopnea, lightheadedness, dizziness, pre-syncope, syncope,  wheezing, shortness of air, dyspnea, sputum production, or hemoptysis. He uses no tobacco products.      Physical Exam  General Appearance: resting comfortably, no acute distress, face mask on  Skin: warm, moist  Eyes: conjunctivae and lids normal, pupils are equal and round  Ears: No deformities  Neck Veins: neck veins are flat, neck veins are not distended  Chest Inspection: chest is normal in appearance  Respiratory Effort: breathing is unlabored, no respiratory distress  Auscultation/Percussion: lungs clear to auscultation, no rales, rhonchi, or wheezing  PMI: PMI not enlarged or displaced  Cardiac Rhythm: regular rhythm and normal rate  Cardiac Auscultation: Normal S1 & S2, no S3 or S4, no rub  Murmurs: no cardiac murmurs   Carotid Arteries: normal carotid upstroke bilaterally, no bruits  Pedal Pulses: normal symmetric pedal pulses  Lower Extremity Edema: scant bilateral lower extremity edema  Abdominal  Exam: soft, non-tender, no masses, bowel sounds normal  Abdominal Aorta: nonpalpable abdominal aorta; no abdominal bruits  Liver & Spleen: no organomegaly  Gait & Station: normal balance and gait  Muscle Strength: normal strength and tone  Neurologic Exam: neurological assessment grossly intact  Orientation: oriented to time, place and person  Affect & Mood: appropriate and sustained affect  Other: moves all extremities                    Current Medications (including today's revisions)  ? aspirin 81 mg chewable tablet Take 81 mg by mouth daily.   ? carvediloL (COREG) 25 mg tablet Take with food. Take 2 of the 25 mg tablets twice daily   ? cyclobenzaprine (FLEXERIL) 10 mg tablet Take 10 mg by mouth three times daily as needed for Muscle Cramps.   ? cycloSPORINE (RESTASIS) 0.05 % ophthalmic emulsion Apply 1 drop to both eyes twice daily.   ? dilTIAZem CD (CARDIZEM CD) 180 mg capsule Take one capsule by mouth daily.   ? DOCOSAHEXANOIC ACID/EPA (FISH OIL PO) Take  by mouth.   ? fluticasone propionate (FLONASE) 50 mcg/actuation nasal spray, suspension Apply 2 sprays to each nostril as directed daily. Shake bottle gently before using.   ? furosemide (LASIX) 40 mg tablet Take 2 of the 40 mg tablets each am   ? GLUCOSAM-CHONDRO-HERB 149-HYAL (GLUCOS CHOND CPLX ADVANCED PO) Take  by mouth.   ? HYDROcodone/acetaminophen (NORCO) 7.5/325 mg tablet Take 1 tablet by mouth four times daily as needed   ? loratadine (CLARITIN) 10 mg tablet Take 10 mg by mouth daily.   ? metformin-ER(+) (FORTAMET) 1,000 mg tablet Take 500 mg by mouth twice daily.   ? methIMAzole (TAPAZOLE) 10 mg tablet Take 2.5 mg by mouth daily.   ? MULTIVITAMIN W-MINERALS/LUTEIN (CENTRUM SILVER PO) Take  by mouth.   ? potassium chloride (MICRO-K) 10 mEq capsule Take two capsules by mouth twice daily. Take with a meal and a full glass of water.   ? prednisolone acetate (PRED FORTE) 1 % ophthalmic suspension daily.   ? rosuvastatin (CRESTOR) 10 mg tablet Take one tablet by mouth daily.   ? triamcinolone acetonide (KENALOG) 0.1 % topical cream twice daily as needed.   ? XARELTO 20 mg tablet Take 1 tablet by mouth once daily with food

## 2020-04-09 ENCOUNTER — Encounter: Admit: 2020-04-09 | Discharge: 2020-04-09 | Payer: MEDICARE

## 2020-04-09 MED ORDER — XARELTO 20 MG PO TAB
ORAL_TABLET | Freq: Every day | 0 refills
Start: 2020-04-09 — End: ?

## 2020-04-20 ENCOUNTER — Encounter: Admit: 2020-04-20 | Discharge: 2020-04-20 | Payer: MEDICARE

## 2020-04-20 DIAGNOSIS — I10 Essential (primary) hypertension: Secondary | ICD-10-CM

## 2020-04-20 NOTE — Telephone Encounter
Pt notified of results and recommendations and verbalized understanding. Lab order sent to [facility]. All questions addressed and patient to call [phone] if there are further concerns.

## 2020-04-20 NOTE — Telephone Encounter
-----   Message from Humberto Seals, APRN-NP sent at 04/20/2020 12:06 PM CDT -----  Regarding: Labs  Please ask Dmari to decrease his spiro to 12.5 mg every other day to see if his lower leg edema will stay minimal and creat also decrease.    Recheck a BMP in 2 weeks. Continue to work at sodium and fluid restriction.    Thank you,    Fannie Knee  ----- Message -----  From: Ellie Lunch, RN  Sent: 04/17/2020   2:22 PM CDT  To: Hettie Holstein Nordhues, APRN-NP    2-week f/u BMP after adding spironolactone 12.5 mg daily on 04/02/20.  Also on potassium 10 mEq BID and Lasix 80 mg daily.

## 2020-04-28 ENCOUNTER — Encounter: Admit: 2020-04-28 | Discharge: 2020-04-28 | Payer: MEDICARE

## 2020-04-28 NOTE — Telephone Encounter
04/28/2020 1:08 PM   Recommendations shared with patient.  Colvin Caroli, LPN      ----- Message -----  From: Humberto Seals, APRN-NP  Sent: 04/28/2020  12:33 PM CDT  To: Colvin Caroli, LPN  Subject: Labs                                             Labs improved. Continue same plan of care.  Charlena Cross  ----- Message -----  From: Colvin Caroli, LPN  Sent: 78/10/6960  10:14 AM CDT  To: Humberto Seals, APRN-NP    Lab for your review. Patient's spironolactone was decreased to 12.5 mg on 04/20/20. Patient reports swelling has resolved. Thanks

## 2020-06-03 ENCOUNTER — Encounter: Admit: 2020-06-03 | Discharge: 2020-06-03 | Payer: MEDICARE

## 2020-07-23 ENCOUNTER — Encounter: Admit: 2020-07-23 | Discharge: 2020-07-23 | Payer: MEDICARE

## 2020-07-23 ENCOUNTER — Ambulatory Visit: Admit: 2020-07-23 | Discharge: 2020-07-24 | Payer: MEDICARE

## 2020-07-23 DIAGNOSIS — I5032 Chronic diastolic (congestive) heart failure: Secondary | ICD-10-CM

## 2020-07-23 DIAGNOSIS — I499 Cardiac arrhythmia, unspecified: Secondary | ICD-10-CM

## 2020-07-23 DIAGNOSIS — E119 Type 2 diabetes mellitus without complications: Secondary | ICD-10-CM

## 2020-07-23 DIAGNOSIS — H18519 Fuchs' corneal dystrophy: Secondary | ICD-10-CM

## 2020-07-23 DIAGNOSIS — Z23 Encounter for immunization: Secondary | ICD-10-CM

## 2020-07-23 DIAGNOSIS — I10 Essential (primary) hypertension: Secondary | ICD-10-CM

## 2020-07-23 DIAGNOSIS — R600 Localized edema: Secondary | ICD-10-CM

## 2020-07-23 DIAGNOSIS — E669 Obesity, unspecified: Secondary | ICD-10-CM

## 2020-07-23 DIAGNOSIS — Z9989 Dependence on other enabling machines and devices: Secondary | ICD-10-CM

## 2020-07-23 DIAGNOSIS — M1990 Unspecified osteoarthritis, unspecified site: Secondary | ICD-10-CM

## 2020-07-23 DIAGNOSIS — G510 Bell's palsy: Secondary | ICD-10-CM

## 2020-07-23 DIAGNOSIS — G4733 Obstructive sleep apnea (adult) (pediatric): Secondary | ICD-10-CM

## 2020-07-23 DIAGNOSIS — C61 Malignant neoplasm of prostate: Secondary | ICD-10-CM

## 2020-07-23 DIAGNOSIS — J302 Other seasonal allergic rhinitis: Secondary | ICD-10-CM

## 2020-07-23 DIAGNOSIS — Z720 Tobacco use: Secondary | ICD-10-CM

## 2020-07-23 MED ORDER — FUROSEMIDE 40 MG PO TAB
40 mg | ORAL_TABLET | Freq: Every morning | ORAL | 1 refills | 90.00000 days | Status: AC
Start: 2020-07-23 — End: ?

## 2020-07-23 MED ORDER — SPIRONOLACTONE 25 MG PO TAB
25 mg | ORAL_TABLET | Freq: Every day | ORAL | 1 refills | 90.00000 days | Status: AC
Start: 2020-07-23 — End: ?

## 2020-07-23 NOTE — Progress Notes
Date of Service: 07/23/2020    Tristan Rosales is a 64 y.o. male.       HPI     Tristan Rosales is a 64 year old white male, he was last seen in the office in May 2021.    Patient is cardiovascular/clinical issues are:    1.  Permanent atrial fibrillation?his heart rate is under good control, patient currently takes a combination of carvedilol and diltiazem, he is anticoagulated with rivaroxaban  2.  Chronic diastolic dysfunction?this is very likely due to underlying atrial fibrillation as well as morbid obesity  3.  Longstanding history of bilateral lower extremity edema?the etiology of this is again permanent atrial fibrillation, diastolic hypertension (during the office the blood pressure was 134/100 mmHg), side effect of the nondihydropyridine  calcium channel blocker, perhaps not following a strict low-sodium diet.  Patient is on a combination of a loop diuretic and Aldactone inhibitor, he states that the diuretic does interfere with his schedule.  4.  Normal cholesterol profile?patient has been on a small dose of statin therapy.  The most recent cholesterol dated 07/13/2020 demonstrated the following: TC =112 mg/dL, TG =43 mg/dL, HDL =32 mg/dL, and LDL =95 mg/dL.  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  5.  Morbid obesity?currently patient's BMI is 43.89 kg/m?, according to our records patient did gain 12 pounds since the previous evaluation on 04/02/2020.  6.  Patient does have a history of prostate cancer, he received treatment  7.  Orthopedic issues?she does use 2 canes for ambulation.  Patient reports that he does have significant osteoarthritic changes of the right foot and it is anticipated that he will require orthopedic intervention and surgical correction, this has not been scheduled yet.  8.  Patient does not report symptoms of chest pain, no heart palpitations.  9.  Last evaluation with an echocardiogram was performed in January 2021?it revealed normal systolic function the LA was severely enlarged, there were no significant valvular abnormalities  10.  Perfusion imaging study performed in May 2019?negative for ischemia normal ejection fraction  11.  Twelve-lead EKG performed today demonstrated atrial fibrillation, ventricular rate 80 bpm       Vitals:    07/23/20 1047   BP: (!) 134/100   BP Source: Arm, Right Upper   Patient Position: Sitting   Pulse: 84   SpO2: 97%   Weight: (!) 163.6 kg (360 lb 9.6 oz)   Height: 1.93 m (6' 4)   PainSc: Four     Body mass index is 43.89 kg/m?[name]     Past Medical History  Patient Active Problem List    Diagnosis Date Noted   ? Bilateral lower extremity edema 12/02/2019   ? COVID-19 vaccine administered 12/02/2019   ? COVID-19 vaccine administered 12/02/2019   ? Permanent atrial fibrillation (HCC) 08/08/2019   ? Obstructive sleep apnea 02/19/2019   ? Persistent atrial fibrillation (HCC) 01/21/2019     02/08/2019 - TEE / DCCV:  Successful DC cardioversion of Atrial fibrillation to sinus rhythm.  2 d later AF, not feeling any different those 2 days     ? Tobacco abuse 11/23/2017   ? Essential hypertension 10/19/2012   ? Osteoarthritis 10/19/2012   ? Morbid obesity (HCC) 10/19/2012   ? Type 2 diabetes mellitus without complication, without long-term current use of insulin (HCC) 10/19/2012   ?  Prostate cancer (HCC) 10/02/2012     TRUS-Bx, by Dr. [name] 08/17/2012 3+4 in 2 cores on L, PSA 6.97, negative CT             Review of Systems   Constitutional: Positive for weight gain.        Patient reports a ten lb weight gain.    HENT: Negative.    Eyes: Negative.    Cardiovascular: Positive for leg swelling.        Patient reports that his systolic blood pressures have been 150-160 recently.    Respiratory: Positive for cough.    Endocrine: Negative.    Hematologic/Lymphatic: Negative.    Skin: Negative. Musculoskeletal: Positive for neck pain.   Gastrointestinal: Negative.    Genitourinary: Negative.    Neurological: Negative.    Psychiatric/Behavioral: Negative.    Allergic/Immunologic: Positive for environmental allergies.       Physical Exam  General Appearance: Morbidly obese, BMI 43.89 kg/m?  Skin: warm, moist, no ulcers or xanthomas  Eyes: conjunctivae and lids normal, pupils are equal and round  Lips & Oral Mucosa: no pallor or cyanosis  Neck Veins: neck veins are flat, neck veins are not distended  Chest Inspection: chest is normal in appearance  Respiratory Effort: breathing comfortably, no respiratory distress  Auscultation/Percussion: lungs clear to auscultation, no rales or rhonchi, no wheezing  Cardiac Rhythm: Irregular irregular rhythm, normal rate  Cardiac Auscultation: S1, S2 normal, no rub, no gallop  Murmurs: no murmur  Carotid Arteries: normal carotid upstroke bilaterally, no bruit  Abdominal aorta: could not be examined due to obese adomen  Lower Extremity Edema: 2+ bilateral  lower extremity edema  Abdominal Exam: soft, non-tender, no masses, bowel sounds normal  Liver & Spleen: no organomegaly  Language and Memory: patient responsive and seems to comprehend information  Neurologic Exam: neurological assessment grossly intact      Cardiovascular Studies    Twelve-lead EKG performed today demonstrated atrial fibrillation, ventricular rate 80 bpm    Cardiovascular Health Factors  Vitals BP Readings from Last 3 Encounters:   07/23/20 (!) 134/100   04/02/20 (!) 142/80   02/13/20 (!) 144/88     Wt Readings from Last 3 Encounters:   07/23/20 (!) 163.6 kg (360 lb 9.6 oz)   04/02/20 (!) 158.1 kg (348 lb 9.6 oz)   02/13/20 (!) 156 kg (344 lb)     BMI Readings from Last 3 Encounters:   07/23/20 43.89 kg/m?   04/02/20 42.43 kg/m?   02/13/20 41.87 kg/m?      Smoking Social History     Tobacco Use   Smoking Status Former Smoker   ? Packs/day: 0.30   ? Years: 24.00   ? Pack years: 7.20   ? Types: Cigarettes   ? Quit date: 06/26/2017   ? Years since quitting: 3.0   Smokeless Tobacco Never Used      Lipid Profile Cholesterol   Date Value Ref Range Status   07/21/2020 112 <200 mg/dL Final     HDL   Date Value Ref Range Status   07/21/2020 57 > OR = 40 mg/dL Final     LDL   Date Value Ref Range Status   07/21/2020 42 mg/dL (calc) Final     Comment:     Reference range: <100     Desirable range <100 mg/dL for primary prevention;    <70 mg/dL for patients with CHD or diabetic patients   with > or = 2 CHD risk factors.  LDL-C is now calculated using the Martin-Hopkins   calculation, which is a validated novel method providing   better accuracy than the Friedewald equation in the   estimation of LDL-C.   Horald Pollen et al. Lenox Ahr. 4034;742(59): 2061-2068   (http://education.[facility].com/faq/FAQ164)       Triglycerides   Date Value Ref Range Status   07/21/2020 51 <150 mg/dL Final      Blood Sugar Hemoglobin A1C   Date Value Ref Range Status   06/27/2017 5.2  Final     Glucose   Date Value Ref Range Status   04/27/2020 183 (H) 65 - 139 mg/dL Final     Comment:               Non-fasting reference interval        04/16/2020 162 (H) 65 - 139 mg/dL Final     Comment:               Non-fasting reference interval        02/13/2020 106 65 - 139 mg/dL Final     Comment:               Non-fasting reference interval          Glucose, POC   Date Value Ref Range Status   02/08/2019 89 70 - 100 MG/DL Final   56/38/7564 98 70 - 100 MG/DL Final          Problems Addressed Today  Encounter Diagnoses   Name Primary?   ? Persistent atrial fibrillation (HCC) Yes   ? Essential hypertension    ? Bilateral lower extremity edema    ? COVID-19 vaccine administered    ? Morbid obesity (HCC)    ? Obstructive sleep apnea    ? Type 2 diabetes mellitus without complication, without long-term current use of insulin (HCC)    ? Chronic diastolic heart failure (HCC)    ? Use of cane as ambulatory aid        Assessment and Plan        In summary: This is a 64 year old white male with permanent atrial fibrillation, the ventricular response is under good control, patient is on a combination of beta-blocker and nonhydropyridine calcium channel central blocker, he is anticoagulated with a factor Xa inhibitor.    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    Plan:    1.  Decrease furosemide to 40 mg p.o. daily  2.  Increase spironolactone to 25 mg p.o. daily  3.  Discontinue rosuvastatin  4.  We discussed today at length about the pathophysiology of the lower extremity edema, diastolic heart failure and diastolic hypertension  5.  I do recommend weight reduction, overall risk factors modification and a strict low-sodium diet  6.  Follow-up office visit in 6 months    Total Time Today was 45 minutes in the following activities: Preparing to see the patient, Obtaining and/or reviewing separately obtained history, Performing a medically appropriate examination and/or evaluation, Counseling and educating the patient/family/caregiver, Ordering medications, tests, or procedures, Referring and communication with other health care professionals (when not separately reported), Documenting clinical information in the electronic or other health record, Independently interpreting results (not separately reported) and communicating results to the patient/family/caregiver and Care coordination (not separately reported)  Current Medications (including today's revisions)  ? aspirin 81 mg chewable tablet Take 81 mg by mouth daily.   ? carvediloL (COREG) 25 mg tablet Take with food. Take 2 of the 25 mg tablets twice daily   ? cycloSPORINE (RESTASIS) 0.05 % ophthalmic emulsion Apply 1 drop to both eyes twice daily.   ? dilTIAZem CD (CARDIZEM CD) 180 mg capsule Take one capsule by mouth daily.   ? DOCOSAHEXANOIC ACID/EPA (FISH OIL PO) Take  by mouth.   ? docusate (COLACE) 100 mg capsule Take 100 mg by mouth twice daily.   ? fluticasone propionate (FLONASE) 50 mcg/actuation nasal spray, suspension Apply 2 sprays to each nostril as directed daily. Shake bottle gently before using.   ? furosemide (LASIX) 40 mg tablet Take 2 of the 40 mg tablets each am   ? GLUCOSAM-CHONDRO-HERB 149-HYAL (GLUCOS CHOND CPLX ADVANCED PO) Take  by mouth.   ? HYDROcodone/acetaminophen (NORCO) 10/325 mg tablet Take 1 tablet by mouth four times daily as needed     ? loratadine (CLARITIN) 10 mg tablet Take 10 mg by mouth daily.   ? metformin-ER(+) (FORTAMET) 1,000 mg tablet Take 500 mg by mouth twice daily.   ? methIMAzole (TAPAZOLE) 10 mg tablet Take 2.5 mg by mouth daily.   ? MULTIVITAMIN W-MINERALS/LUTEIN (CENTRUM SILVER PO) Take  by mouth.   ? potassium chloride (MICRO-K) 10 mEq capsule Take one capsule by mouth twice daily. Take with a meal and a full glass of water.   ? prednisolone acetate (PRED FORTE) 1 % ophthalmic suspension Apply 1 drop to both eyes daily. Paulo Fruit, Friday   ? rosuvastatin (CRESTOR) 5 mg tablet Take one tablet by mouth daily. (Patient taking differently: Take 2.5 mg by mouth daily.)   ? spironolactone (ALDACTONE) 25 mg tablet Take with food. Take 1/2 of the 25 mg tablet each am   ? triamcinolone acetonide (KENALOG) 0.1 % topical cream twice daily as needed.   ? XARELTO 20 mg tablet Take 1 tablet by mouth once daily with food

## 2020-07-24 DIAGNOSIS — I4819 Other persistent atrial fibrillation: Principal | ICD-10-CM

## 2020-09-10 MED ORDER — SPIRONOLACTONE 25 MG PO TAB
25 mg | ORAL_TABLET | Freq: Every day | ORAL | 1 refills | 90.00000 days | Status: AC
Start: 2020-09-10 — End: ?

## 2020-09-10 NOTE — Telephone Encounter
Received a fax from the patients local pharmacy requesting a refill. Rx sent as requested by the local pharmacy

## 2020-10-22 ENCOUNTER — Encounter: Admit: 2020-10-22 | Discharge: 2020-10-22 | Payer: MEDICARE

## 2020-10-22 DIAGNOSIS — R69 Illness, unspecified: Secondary | ICD-10-CM

## 2020-11-11 ENCOUNTER — Encounter: Admit: 2020-11-11 | Discharge: 2020-11-11 | Payer: MEDICARE

## 2020-11-11 DIAGNOSIS — I10 Essential (primary) hypertension: Secondary | ICD-10-CM

## 2020-11-11 MED ORDER — POTASSIUM CHLORIDE 10 MEQ PO CPER
ORAL_CAPSULE | Freq: Two times a day (BID) | 3 refills | 30.00000 days | Status: AC
Start: 2020-11-11 — End: ?

## 2020-11-11 NOTE — Telephone Encounter
Received a request via computer from the patients pharmacy requesting a refill.  Script e-scribed as requested.

## 2020-11-25 ENCOUNTER — Encounter: Admit: 2020-11-25 | Discharge: 2020-11-25 | Payer: MEDICARE

## 2020-11-25 DIAGNOSIS — M79671 Pain in right foot: Secondary | ICD-10-CM

## 2020-11-26 ENCOUNTER — Ambulatory Visit: Admit: 2020-11-26 | Discharge: 2020-11-26 | Payer: MEDICARE

## 2020-11-26 ENCOUNTER — Encounter: Admit: 2020-11-26 | Discharge: 2020-11-26 | Payer: MEDICARE

## 2020-11-26 DIAGNOSIS — J302 Other seasonal allergic rhinitis: Secondary | ICD-10-CM

## 2020-11-26 DIAGNOSIS — Z720 Tobacco use: Secondary | ICD-10-CM

## 2020-11-26 DIAGNOSIS — I10 Essential (primary) hypertension: Secondary | ICD-10-CM

## 2020-11-26 DIAGNOSIS — G510 Bell's palsy: Secondary | ICD-10-CM

## 2020-11-26 DIAGNOSIS — I499 Cardiac arrhythmia, unspecified: Secondary | ICD-10-CM

## 2020-11-26 DIAGNOSIS — H18519 Fuchs' corneal dystrophy: Secondary | ICD-10-CM

## 2020-11-26 DIAGNOSIS — E669 Obesity, unspecified: Secondary | ICD-10-CM

## 2020-11-26 DIAGNOSIS — E119 Type 2 diabetes mellitus without complications: Secondary | ICD-10-CM

## 2020-11-26 DIAGNOSIS — M79671 Pain in right foot: Secondary | ICD-10-CM

## 2020-11-26 DIAGNOSIS — M1990 Unspecified osteoarthritis, unspecified site: Secondary | ICD-10-CM

## 2020-11-26 DIAGNOSIS — C61 Malignant neoplasm of prostate: Secondary | ICD-10-CM

## 2020-12-08 ENCOUNTER — Encounter: Admit: 2020-12-08 | Discharge: 2020-12-08 | Payer: MEDICARE

## 2021-01-21 ENCOUNTER — Encounter: Admit: 2021-01-21 | Discharge: 2021-01-21 | Payer: MEDICARE

## 2021-01-21 DIAGNOSIS — M14671 Charcot's joint, right ankle and foot: Secondary | ICD-10-CM

## 2021-01-22 ENCOUNTER — Encounter: Admit: 2021-01-22 | Discharge: 2021-01-22 | Payer: MEDICARE

## 2021-01-22 ENCOUNTER — Ambulatory Visit: Admit: 2021-01-22 | Discharge: 2021-01-22 | Payer: MEDICARE

## 2021-01-22 DIAGNOSIS — Z9989 Dependence on other enabling machines and devices: Secondary | ICD-10-CM

## 2021-01-22 DIAGNOSIS — I5032 Chronic diastolic (congestive) heart failure: Secondary | ICD-10-CM

## 2021-01-22 DIAGNOSIS — G4733 Obstructive sleep apnea (adult) (pediatric): Secondary | ICD-10-CM

## 2021-01-22 DIAGNOSIS — I499 Cardiac arrhythmia, unspecified: Secondary | ICD-10-CM

## 2021-01-22 DIAGNOSIS — E669 Obesity, unspecified: Secondary | ICD-10-CM

## 2021-01-22 DIAGNOSIS — M1990 Unspecified osteoarthritis, unspecified site: Secondary | ICD-10-CM

## 2021-01-22 DIAGNOSIS — J302 Other seasonal allergic rhinitis: Secondary | ICD-10-CM

## 2021-01-22 DIAGNOSIS — E119 Type 2 diabetes mellitus without complications: Secondary | ICD-10-CM

## 2021-01-22 DIAGNOSIS — C61 Malignant neoplasm of prostate: Secondary | ICD-10-CM

## 2021-01-22 DIAGNOSIS — H18519 Fuchs' corneal dystrophy: Secondary | ICD-10-CM

## 2021-01-22 DIAGNOSIS — G510 Bell's palsy: Secondary | ICD-10-CM

## 2021-01-22 DIAGNOSIS — R600 Localized edema: Secondary | ICD-10-CM

## 2021-01-22 DIAGNOSIS — Z720 Tobacco use: Secondary | ICD-10-CM

## 2021-01-22 DIAGNOSIS — I4819 Other persistent atrial fibrillation: Secondary | ICD-10-CM

## 2021-01-22 DIAGNOSIS — I10 Essential (primary) hypertension: Secondary | ICD-10-CM

## 2021-01-22 DIAGNOSIS — I4821 Permanent atrial fibrillation: Secondary | ICD-10-CM

## 2021-01-22 NOTE — Patient Instructions
1 - Follow up in 1 year    Thank you for visiting our office today.    My Nursing team's voice mail: [phone] - Please leave your name, date of birth and a brief message and someone will call you back.  Fax: [phone]   Scheduling: [phone]  (For appointments six months or further out, call approximatley four months before appointment is due, for best availability.)     Please use the MyChart on-line portal (or mobile application) to send [name] a message.  NOTE: MyChart messages and phone calls received on weekends, on holidays, and after 4 pm on weekdays will NOT be seen until the following business day. If you have an urgent matter during these times, please call the main switchboard at [phone] to reach the on-call team.

## 2021-01-22 NOTE — Progress Notes
Date of Service: 01/22/2021    Tristan Rosales is a 65 y.o. male.       HPI     Patient is a 65 year old white male with a history of permanent, asymptomatic atrial fibrillation, currently anticoagulated with rivaroxaban, history of hypertension, prostate cancer and reported to be in complete remission at present time, primary hypertension under good control, diastolic heart failure with chronic bilateral lower extremity edema currently well managed on a combination of furosemide and spironolactone, hyperlipidemia, morbid obesity, BMI 43.33 kg/m?Tristan Rosales    Patient is doing well from a cardiac standpoint, he does not report any symptoms of chest pain, no heart palpitations, no presyncope or syncope.    He does not have any documented history of coronary artery disease, patient was evaluated with a nuclear stress test in May 2019, the tomographic pattern did not demonstrate ischemia, normal LVEF = 55%.    Patient does have heart failure preserved ejection fraction with chronic bilateral lower extremity edema, today on the physical exam it was no pretibial pitting edema and he appears to be doing well from a volume standpoint.         Vitals:    01/22/21 1006 01/22/21 1013   BP: 122/86  Comment: Manual bp cuff (!) 118/91  Comment: His home monitor   BP Source: Arm, Left Upper Arm, Left Upper   Pulse: 84    SpO2: 96%    O2 Device: None (Room air)    PainSc: Zero    Weight: (!) 161.5 kg (356 lb)    Height: 193 cm (6' 4)      Body mass index is 43.33 kg/m?Tristan Rosales     Past Medical History  Patient Active Problem List    Diagnosis Date Noted   ? Diastolic heart failure (HCC) 07/23/2020   ? Use of cane as ambulatory aid 07/23/2020   ? Bilateral lower extremity edema 12/02/2019   ? COVID-19 vaccine administered 12/02/2019   ? COVID-19 vaccine administered 12/02/2019   ? Permanent atrial fibrillation (HCC) 08/08/2019   ? Obstructive sleep apnea 02/19/2019   ? Persistent atrial fibrillation (HCC) 01/21/2019     02/08/2019 - TEE / DCCV:  Successful DC cardioversion of Atrial fibrillation to sinus rhythm.  2 d later AF, not feeling any different those 2 days     ? Tobacco abuse 11/23/2017   ? Essential hypertension 10/19/2012   ? Osteoarthritis 10/19/2012   ? Morbid obesity (HCC) 10/19/2012   ? Type 2 diabetes mellitus without complication, without long-term current use of insulin (HCC) 10/19/2012   ? Prostate cancer (HCC) 10/02/2012     TRUS-Bx, by Dr. [name] 08/17/2012 3+4 in 2 cores on L, PSA 6.97, negative CT             ROS     Constitutional: denies fever, chills, night sweats, weight loss  Eyes: denies double vision, sudden vision loss  ENT & mouth: no ringing in the ears, mucosas are moist  Cardiovascular: denies chest pain, palpitations  Respiratory:denies cough, shortness of breath  GI: denies nausea, vomiting, diarrhea, constipation  GU: denies increased urinary frequency, dysuria  Musculoskeletal: denies joint pain, weakness  Skin: denies new skin rash or skin lesions  Neurological: denies dizziness, denies loss of consciousness   Psychiatric: denies depression, suicidal ideation  Endocrine: denies heat or cold intolerance  Hematology/lymphatic: denies abnormal bleeding, bruising, new lymph nodes       Physical Exam    General Appearance: obese, uses a cane for ambulation  Skin: warm, moist, no ulcers or xanthomas  Eyes: conjunctivae and lids normal, pupils are equal and round  Lips & Oral Mucosa: no pallor or cyanosis  Neck Veins: neck veins are flat, neck veins are not distended  Chest Inspection: chest is normal in appearance  Respiratory Effort: breathing comfortably, no respiratory distress  Auscultation/Percussion: lungs clear to auscultation, no rales or rhonchi, no wheezing  Cardiac Rhythm: irregularly irregular rhythm and normal rate  Cardiac Auscultation: S1, S2 normal, no rub, no gallop  Murmurs: no murmur  Carotid Arteries: normal carotid upstroke bilaterally, no bruit  Abdominal aorta: could not be examined due to obese adomen  Lower Extremity Edema: no lower extremity edema  Abdominal Exam: soft, non-tender, no masses, bowel sounds normal  Liver & Spleen: no organomegaly  Language and Memory: patient responsive and seems to comprehend information  Neurologic Exam: neurological assessment grossly intact      Cardiovascular Studies      Cardiovascular Health Factors  Vitals BP Readings from Last 3 Encounters:   01/22/21 (!) 118/91   07/23/20 (!) 134/100   04/02/20 (!) 142/80     Wt Readings from Last 3 Encounters:   01/22/21 (!) 161.5 kg (356 lb)   11/26/20 (!) 158.8 kg (350 lb)   07/23/20 (!) 163.6 kg (360 lb 9.6 oz)     BMI Readings from Last 3 Encounters:   01/22/21 43.33 kg/m?   11/26/20 42.60 kg/m?   07/23/20 43.89 kg/m?      Smoking Social History     Tobacco Use   Smoking Status Former Smoker   ? Packs/day: 0.30   ? Years: 24.00   ? Pack years: 7.20   ? Types: Cigarettes   ? Quit date: 06/26/2017   ? Years since quitting: 3.5   Smokeless Tobacco Never Used      Lipid Profile Cholesterol   Date Value Ref Range Status   07/21/2020 112 <200 mg/dL Final     HDL   Date Value Ref Range Status   07/21/2020 57 > OR = 40 mg/dL Final     LDL   Date Value Ref Range Status   07/21/2020 42 mg/dL (calc) Final     Comment:     Reference range: <100     Desirable range <100 mg/dL for primary prevention;    <70 mg/dL for patients with CHD or diabetic patients   with > or = 2 CHD risk factors.     LDL-C is now calculated using the Martin-Hopkins   calculation, which is a validated novel method providing   better accuracy than the Friedewald equation in the   estimation of LDL-C.   Horald Pollen et al. Lenox Ahr. 1610;960(45): 2061-2068   (http://education.[facility].com/faq/FAQ164)       Triglycerides   Date Value Ref Range Status   07/21/2020 51 <150 mg/dL Final      Blood Sugar Hemoglobin A1C   Date Value Ref Range Status   06/27/2017 5.2  Final     Glucose   Date Value Ref Range Status   04/27/2020 183 (H) 65 - 139 mg/dL Final     Comment: Non-fasting reference interval        04/16/2020 162 (H) 65 - 139 mg/dL Final     Comment:               Non-fasting reference interval        02/13/2020 106 65 - 139 mg/dL Final     Comment:  Non-fasting reference interval          Glucose, POC   Date Value Ref Range Status   02/08/2019 89 70 - 100 MG/DL Final   16/04/9603 98 70 - 100 MG/DL Final          Problems Addressed Today  Encounter Diagnoses   Name Primary?   ? Chronic diastolic heart failure (HCC) Yes   ? Essential hypertension    ? Permanent atrial fibrillation (HCC)    ? Persistent atrial fibrillation (HCC)    ? Bilateral lower extremity edema    ? Morbid obesity (HCC)    ? Obstructive sleep apnea    ? Type 2 diabetes mellitus without complication, without long-term current use of insulin (HCC)    ? Use of cane as ambulatory aid    ? Prostate cancer (HCC)    ? Osteoarthritis, unspecified osteoarthritis type, unspecified site        Assessment and Plan:      Assessment:    1.  Chronic diastolic heart failure/HFpEF --this is multifactorial and probably secondary to underlying permanent atrial fibrillation, morbid obesity.  Today on the physical exam there were no signs of volume overload at the present time patient appears to maintain an euvolemic status with the current combination of loop diuretic and Aldactone inhibitor.  He did have some concerns regarding hyperkalemia in the setting of taking an Aldactone inhibitor and also potassium replacement.  2.  Permanent atrial fibrillation- the rate is well controlled, patient is anticoagulated with rivaroxaban  3.  Morbid obesity-patient's BMI is 43.33 kg/m?  4.  Diabetes mellitus type 2-patient is on metformin XR  5.  Known history of prostate cancer-patient is currently in complete remission and he continues to undergo surveillance PSAs    Plan:    1.  I asked the patient to continue all current medications  2.  Check chemistry profile with your office, if the potassium is at the upper limits of normal then potassium replacement can be either discontinued or reduced to 10 mEq p.o. daily, patient will continue to take the furosemide and the spironolactone  3.  I suggested risk factors modification, strict low-sodium diet.  4.  Follow-up office visit in 10 to 12 months.    Total Time Today was 30 minutes in the following activities: Preparing to see the patient, Obtaining and/or reviewing separately obtained history, Performing a medically appropriate examination and/or evaluation, Counseling and educating the patient/family/caregiver, Ordering medications, tests, or procedures, Referring and communication with other health care professionals (when not separately reported), Documenting clinical information in the electronic or other health record, Independently interpreting results (not separately reported) and communicating results to the patient/family/caregiver and Care coordination (not separately reported)         Current Medications (including today's revisions)  ? aspirin 81 mg chewable tablet Take 81 mg by mouth daily.   ? buPROPion XL (WELLBUTRIN XL) 150 mg tablet Take 150 mg by mouth every morning.   ? carvediloL (COREG) 25 mg tablet Take with food. Take 2 of the 25 mg tablets twice daily   ? cycloSPORINE (RESTASIS) 0.05 % ophthalmic emulsion Apply 1 drop to both eyes twice daily.   ? dilTIAZem CD (CARDIZEM CD) 180 mg capsule Take one capsule by mouth daily.   ? DOCOSAHEXANOIC ACID/EPA (FISH OIL PO) Take  by mouth.   ? docusate (COLACE) 100 mg capsule Take 100 mg by mouth twice daily.   ? duloxetine DR (CYMBALTA) 60 mg capsule Take 30  mg by mouth daily.   ? fluticasone propionate (FLONASE) 50 mcg/actuation nasal spray, suspension Apply 2 sprays to each nostril as directed daily. Shake bottle gently before using.   ? furosemide (LASIX) 40 mg tablet Take one tablet by mouth every morning.   ? GLUCOSAM-CHONDRO-HERB 149-HYAL (GLUCOS CHOND CPLX ADVANCED PO) Take  by mouth.   ? HYDROcodone/acetaminophen (NORCO) 7.5/325 mg tablet Take 1 tablet by mouth every 6 hours as needed for Pain.   ? loratadine (CLARITIN) 10 mg tablet Take 10 mg by mouth daily.   ? metformin-ER(+) (FORTAMET) 1,000 mg tablet Take 500 mg by mouth twice daily.   ? methIMAzole (TAPAZOLE) 10 mg tablet Take 2.5 mg by mouth daily.   ? MULTIVITAMIN W-MINERALS/LUTEIN (CENTRUM SILVER PO) Take  by mouth.   ? potassium chloride (KLOR-CON SPRINKLE) 10 mEq capsule TAKE 1 CAPSULE BY MOUTH TWICE A DAY WITH FOOD AND FULL GLASS OF WATER   ? prednisolone acetate (PRED FORTE) 1 % ophthalmic suspension Apply 1 drop to both eyes daily. Paulo Fruit, Friday   ? rosuvastatin (CRESTOR) 5 mg tablet Take one tablet by mouth daily. (Patient taking differently: Take 2.5 mg by mouth daily.)   ? spironolactone (ALDACTONE) 25 mg tablet Take one tablet by mouth daily. Take with food.   ? triamcinolone acetonide (KENALOG) 0.1 % topical cream twice daily as needed.   ? XARELTO 20 mg tablet Take 1 tablet by mouth once daily with food

## 2021-01-26 ENCOUNTER — Ambulatory Visit: Admit: 2021-01-26 | Discharge: 2021-01-26 | Payer: MEDICARE

## 2021-01-26 ENCOUNTER — Encounter: Admit: 2021-01-26 | Discharge: 2021-01-26 | Payer: MEDICARE

## 2021-01-26 DIAGNOSIS — I10 Essential (primary) hypertension: Secondary | ICD-10-CM

## 2021-01-26 DIAGNOSIS — G510 Bell's palsy: Secondary | ICD-10-CM

## 2021-01-26 DIAGNOSIS — J302 Other seasonal allergic rhinitis: Secondary | ICD-10-CM

## 2021-01-26 DIAGNOSIS — Z720 Tobacco use: Secondary | ICD-10-CM

## 2021-01-26 DIAGNOSIS — E669 Obesity, unspecified: Secondary | ICD-10-CM

## 2021-01-26 DIAGNOSIS — C61 Malignant neoplasm of prostate: Secondary | ICD-10-CM

## 2021-01-26 DIAGNOSIS — M14671 Charcot's joint, right ankle and foot: Secondary | ICD-10-CM

## 2021-01-26 DIAGNOSIS — H18519 Fuchs' corneal dystrophy: Secondary | ICD-10-CM

## 2021-01-26 DIAGNOSIS — M1990 Unspecified osteoarthritis, unspecified site: Secondary | ICD-10-CM

## 2021-01-26 DIAGNOSIS — E119 Type 2 diabetes mellitus without complications: Secondary | ICD-10-CM

## 2021-01-26 DIAGNOSIS — I499 Cardiac arrhythmia, unspecified: Secondary | ICD-10-CM

## 2021-03-03 ENCOUNTER — Encounter: Admit: 2021-03-03 | Discharge: 2021-03-03 | Payer: MEDICARE

## 2021-03-03 MED ORDER — DILTIAZEM HCL 180 MG PO CP24
ORAL_CAPSULE | Freq: Every day | 3 refills | 45.00000 days | Status: AC
Start: 2021-03-03 — End: ?

## 2021-03-15 ENCOUNTER — Encounter: Admit: 2021-03-15 | Discharge: 2021-03-15 | Payer: MEDICARE

## 2021-03-15 NOTE — Telephone Encounter
03/15/2021 2:14 PM    Patient's wife called stating patient will be having trigger finger surgery in a couple weeks by Dr Bertram Savin. Patient needs instructions for holding xarelto prior to surgery. Will review with Dr Virgina Organ.  Wilder Glade, LPN

## 2021-03-26 ENCOUNTER — Encounter: Admit: 2021-03-26 | Discharge: 2021-03-26 | Payer: MEDICARE

## 2021-03-26 DIAGNOSIS — I10 Essential (primary) hypertension: Secondary | ICD-10-CM

## 2021-03-26 MED ORDER — CARVEDILOL 25 MG PO TAB
ORAL_TABLET | Freq: Two times a day (BID) | ORAL | 1 refills | 90.00000 days | Status: AC
Start: 2021-03-26 — End: ?

## 2021-03-26 NOTE — Telephone Encounter
03/26/2021 7:58 AM     Received a request via computer from the patients pharmacy requesting a refill.  Script e-scribed as requested.

## 2021-04-05 ENCOUNTER — Encounter: Admit: 2021-04-05 | Discharge: 2021-04-05 | Payer: MEDICARE

## 2021-04-05 MED ORDER — XARELTO 20 MG PO TAB
ORAL_TABLET | Freq: Every day | ORAL | 5 refills | 30.00000 days | Status: AC
Start: 2021-04-05 — End: ?

## 2021-04-05 NOTE — Telephone Encounter
04/05/2021 7:57 AM     Received a request via computer from the patients pharmacy requesting a refill.  Script e-scribed as requested.    Latest labs 09/2020 WNL.

## 2021-04-20 ENCOUNTER — Encounter: Admit: 2021-04-20 | Discharge: 2021-04-20 | Payer: MEDICARE

## 2021-04-20 MED ORDER — RIVAROXABAN 20 MG PO TAB
20 mg | ORAL_TABLET | Freq: Every day | ORAL | 3 refills | 30.00000 days | Status: AC
Start: 2021-04-20 — End: ?

## 2021-04-20 NOTE — Telephone Encounter
Received a request via computer from the patients pharmacy requesting a refill.  Script e-scribed as requested.

## 2021-04-29 ENCOUNTER — Encounter: Admit: 2021-04-29 | Discharge: 2021-04-29 | Payer: MEDICARE

## 2021-05-24 ENCOUNTER — Encounter: Admit: 2021-05-24 | Discharge: 2021-05-24 | Payer: MEDICARE

## 2021-05-24 NOTE — Telephone Encounter
05/24/2021 3:17 PM   Received voicemail from [facility], [phone],  requesting a 3 day hold of xarelto prior to a lumbar radiofrequency ablation by Dr Loretta Plume. Procedure has not been scheduled. Will review with DR Hannen when she returns to clinic on 06/04/21. Dr aggarwal's office notified of plan.  Wilder Glade, LPN

## 2021-06-03 ENCOUNTER — Encounter: Admit: 2021-06-03 | Discharge: 2021-06-03 | Payer: MEDICARE

## 2021-06-03 DIAGNOSIS — E782 Mixed hyperlipidemia: Secondary | ICD-10-CM

## 2021-06-03 MED ORDER — ROSUVASTATIN 5 MG PO TAB
2.5 mg | ORAL_TABLET | Freq: Every day | ORAL | 3 refills | 90.00000 days | Status: AC
Start: 2021-06-03 — End: ?

## 2021-06-03 NOTE — Telephone Encounter
06/03/2021 6:50 AM     Received a request via computer from the patients pharmacy requesting a refill.  Script e-scribed as requested.

## 2021-06-12 ENCOUNTER — Encounter: Admit: 2021-06-12 | Discharge: 2021-06-12 | Payer: MEDICARE

## 2021-06-12 MED ORDER — SPIRONOLACTONE 25 MG PO TAB
ORAL_TABLET | Freq: Every day | 1 refills
Start: 2021-06-12 — End: ?

## 2021-09-26 ENCOUNTER — Encounter: Admit: 2021-09-26 | Discharge: 2021-09-26 | Payer: MEDICARE

## 2021-09-26 DIAGNOSIS — I10 Essential (primary) hypertension: Secondary | ICD-10-CM

## 2021-09-26 MED ORDER — CARVEDILOL 25 MG PO TAB
ORAL_TABLET | 1 refills
Start: 2021-09-26 — End: ?

## 2021-11-02 ENCOUNTER — Encounter: Admit: 2021-11-02 | Discharge: 2021-11-02 | Payer: MEDICARE

## 2021-12-13 ENCOUNTER — Encounter: Admit: 2021-12-13 | Discharge: 2021-12-13 | Payer: MEDICARE

## 2021-12-13 MED ORDER — SPIRONOLACTONE 25 MG PO TAB
ORAL_TABLET | ORAL | 3 refills | 90.00000 days | Status: AC
Start: 2021-12-13 — End: ?

## 2021-12-13 NOTE — Telephone Encounter
Received a request via computer from the patients pharmacy requesting a refill.  Script e-scribed as requested. Patient has office visit scheduled in July and up to date labs in [place].

## 2022-01-31 ENCOUNTER — Encounter: Admit: 2022-01-31 | Discharge: 2022-01-31 | Payer: MEDICARE

## 2022-02-03 ENCOUNTER — Encounter: Admit: 2022-02-03 | Discharge: 2022-02-03 | Payer: MEDICARE

## 2022-02-03 DIAGNOSIS — I10 Essential (primary) hypertension: Secondary | ICD-10-CM

## 2022-02-03 DIAGNOSIS — Z9989 Dependence on other enabling machines and devices: Secondary | ICD-10-CM

## 2022-02-03 DIAGNOSIS — C61 Malignant neoplasm of prostate: Secondary | ICD-10-CM

## 2022-02-03 DIAGNOSIS — M1990 Unspecified osteoarthritis, unspecified site: Secondary | ICD-10-CM

## 2022-02-03 DIAGNOSIS — I5032 Chronic diastolic (congestive) heart failure: Secondary | ICD-10-CM

## 2022-02-03 DIAGNOSIS — J302 Other seasonal allergic rhinitis: Secondary | ICD-10-CM

## 2022-02-03 DIAGNOSIS — I4819 Other persistent atrial fibrillation: Secondary | ICD-10-CM

## 2022-02-03 DIAGNOSIS — I4821 Permanent atrial fibrillation: Secondary | ICD-10-CM

## 2022-02-03 DIAGNOSIS — H18519 Fuchs' corneal dystrophy: Secondary | ICD-10-CM

## 2022-02-03 DIAGNOSIS — E119 Type 2 diabetes mellitus without complications: Secondary | ICD-10-CM

## 2022-02-03 DIAGNOSIS — E669 Obesity, unspecified: Secondary | ICD-10-CM

## 2022-02-03 DIAGNOSIS — Z23 Encounter for immunization: Secondary | ICD-10-CM

## 2022-02-03 DIAGNOSIS — R0989 Other specified symptoms and signs involving the circulatory and respiratory systems: Secondary | ICD-10-CM

## 2022-02-03 DIAGNOSIS — G4733 Obstructive sleep apnea (adult) (pediatric): Secondary | ICD-10-CM

## 2022-02-03 DIAGNOSIS — Z720 Tobacco use: Secondary | ICD-10-CM

## 2022-02-03 DIAGNOSIS — G510 Bell's palsy: Secondary | ICD-10-CM

## 2022-02-03 DIAGNOSIS — I499 Cardiac arrhythmia, unspecified: Secondary | ICD-10-CM

## 2022-02-03 MED ORDER — DILTIAZEM HCL 180 MG PO CP24
ORAL_CAPSULE | 3 refills
Start: 2022-02-03 — End: ?

## 2022-02-03 MED ORDER — CARVEDILOL 25 MG PO TAB
62.5 mg | ORAL_TABLET | Freq: Two times a day (BID) | ORAL | 3 refills | 90.00000 days | Status: AC
Start: 2022-02-03 — End: ?

## 2022-02-03 MED ORDER — FUROSEMIDE 20 MG PO TAB
20 mg | ORAL_TABLET | Freq: Every morning | ORAL | 3 refills | 90.00000 days | Status: AC
Start: 2022-02-03 — End: ?

## 2022-02-03 NOTE — Progress Notes
Date of Service: 02/03/2022    Tristan Rosales is a 66 y.o. male.       HPI     Tristan Rosales is a 66 y.o. white male   with a history of permanent atrial fibrillation, primary hypertension, history of prostate cancer in complete remission, diastolic heart failure with chronic bilateral lower extremity edema currently on spironolactone, hyperlipidemia, recent diagnosis of COPD, morbid obesity, BMI 40.98 kg/m?, osteoarthritis, patient uses 2 canes for ambulation.    Patient does not report having any symptoms of chest pain.  He was last evaluated with a regadenoson MPI in May 2019, he was not found to have ischemia, LVEF = 55%.    Patient states that recently he was diagnosed with COPD.  He is not on supplemental oxygen therapy.    Today in the office his blood pressure was 151/103 mmHg and heart rate was 92 bpm.  He has been experiencing symptoms of shortness of breath that occur with physical activity.         Vitals:    02/03/22 1428   BP: (!) 151/103   BP Source: Arm, Left Lower   Pulse: 92   SpO2: 96%   O2 Device: None (Room air)   PainSc: Six   Weight: (!) 152.7 kg (336 lb 11.2 oz)   Height: 193 cm (6' 4)     Body mass index is 40.98 kg/m?[name]     Past Medical History  Patient Active Problem List    Diagnosis Date Noted   ? Diastolic heart failure (HCC) 07/23/2020   ? Use of cane as ambulatory aid 07/23/2020   ? Bilateral lower extremity edema 12/02/2019   ? COVID-19 vaccine administered 12/02/2019   ? COVID-19 vaccine administered 12/02/2019   ? Permanent atrial fibrillation (HCC) 08/08/2019   ? Obstructive sleep apnea 02/19/2019   ? Persistent atrial fibrillation (HCC) 01/21/2019     02/08/2019 - TEE / DCCV:  Successful DC cardioversion of Atrial fibrillation to sinus rhythm.  2 d later AF, not feeling any different those 2 days     ? Tobacco abuse 11/23/2017   ? Essential hypertension 10/19/2012   ? Osteoarthritis 10/19/2012   ? Morbid obesity (HCC) 10/19/2012   ? Type 2 diabetes mellitus without complication, without long-term current use of insulin (HCC) 10/19/2012   ? Prostate cancer (HCC) 10/02/2012     TRUS-Bx, by Dr. [name] 08/17/2012 3+4 in 2 cores on L, PSA 6.97, negative CT             Review of Systems   Constitutional: Negative.   HENT: Negative.    Eyes: Negative.    Cardiovascular: Negative.    Respiratory: Negative.    Endocrine: Negative.    Hematologic/Lymphatic: Negative.    Skin: Negative.    Musculoskeletal: Negative.    Gastrointestinal: Negative.    Genitourinary: Negative.    Neurological: Negative.    Psychiatric/Behavioral: Negative.    Allergic/Immunologic: Negative.        Physical Exam    General Appearance: obese, BMI = 40.98kg/m?  Skin: warm, moist, no ulcers or xanthomas  Eyes: conjunctivae and lids normal, pupils are equal and round  Lips & Oral Mucosa: no pallor or cyanosis  Neck Veins: neck veins are flat, neck veins are not distended  Chest Inspection: chest is normal in appearance  Respiratory Effort: breathing comfortably, no respiratory distress  Auscultation/Percussion: lungs clear to auscultation, no rales or rhonchi, no wheezing  Cardiac Rhythm: Irregularly irregular rhythm  Cardiac Auscultation: S1, S2 normal, no rub, no gallop  Murmurs: no murmur  Carotid Arteries: normal carotid upstroke bilaterally, no bruit  Abdominal aorta: could not be examined due to obese adomen  Lower Extremity Edema: no lower extremity edema  Abdominal Exam: soft, non-tender, no masses, bowel sounds normal  Liver & Spleen: no organomegaly  Language and Memory: patient responsive and seems to comprehend information  Neurologic Exam: neurological assessment grossly intact      Cardiovascular Studies  Twelve-lead EKG demonstrates atrial fibrillation, ventricular rate 84 bpm, nonspecific ST segment T wave changes, no axis deviation.    Cardiovascular Health Factors  Vitals BP Readings from Last 3 Encounters:   02/03/22 (!) 151/103   01/22/21 (!) 118/91   07/23/20 (!) 134/100     Wt Readings from Last 3 Encounters:   02/03/22 (!) 152.7 kg (336 lb 11.2 oz)   01/26/21 (!) 161.5 kg (356 lb)   01/22/21 (!) 161.5 kg (356 lb)     BMI Readings from Last 3 Encounters:   02/03/22 40.98 kg/m?   01/26/21 43.33 kg/m?   01/22/21 43.33 kg/m?      Smoking Social History     Tobacco Use   Smoking Status Former   ? Packs/day: 0.30   ? Years: 24.00   ? Pack years: 7.20   ? Types: Cigarettes   ? Quit date: 06/26/2017   ? Years since quitting: 4.6   Smokeless Tobacco Never      Lipid Profile Cholesterol   Date Value Ref Range Status   09/17/2021 115  Final     HDL   Date Value Ref Range Status   09/17/2021 54  Final     LDL   Date Value Ref Range Status   09/17/2021 42  Final     Triglycerides   Date Value Ref Range Status   09/17/2021 95  Final      Blood Sugar Hemoglobin A1C   Date Value Ref Range Status   06/27/2017 5.2  Final     Glucose   Date Value Ref Range Status   04/27/2020 183 (H) 65 - 139 mg/dL Final     Comment:               Non-fasting reference interval        04/16/2020 162 (H) 65 - 139 mg/dL Final     Comment:               Non-fasting reference interval        02/13/2020 106 65 - 139 mg/dL Final     Comment:               Non-fasting reference interval          Glucose, POC   Date Value Ref Range Status   02/08/2019 89 70 - 100 MG/DL Final   16/04/9603 98 70 - 100 MG/DL Final          Problems Addressed Today  Encounter Diagnoses   Name Primary?   ? Essential hypertension Yes   ? Persistent atrial fibrillation (HCC)    ? Permanent atrial fibrillation (HCC)    ? Chronic diastolic heart failure (HCC)    ? Prostate cancer (HCC)    ? Morbid obesity (HCC)    ? Type 2 diabetes mellitus without complication, without long-term current use of insulin (HCC)    ? Obstructive sleep apnea    ? COVID-19 vaccine administered    ? Use of cane  as ambulatory aid    ? Cardiovascular symptoms        Assessment and Plan     Assessment:    1.  Increase symptoms of shortness of breath-this is multifactorial and likely secondary to episodic tachycardia in the setting of physical activity and underlying atrial fibrillation, also diastolic heart failure  2.  Chronic HFpEF- multifactorial secondary to diastolic hypertension and underlying permanent atrial fibrillation  3.  Morbid obesity-BMI 40.98 kg/m?  4.  Primary hypertension-suboptimally controlled, currently on carvedilol 50 mg p.o. twice daily, diltiazem and spironolactone  5.  Hyperlipidemia-on rosuvastatin  6.  Permanent atrial fibrillation-patient is anticoagulated with rivaroxaban  7.  Diabetes mellitus type 2-patient is on metformin XR  8.  History of prostate cancer-in complete remission  9.  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  Increase carvedilol to 2 tablets and a half p.o. twice daily (62.5 mg p.o. twice daily)  2.  Start furosemide 20 mg p.o. daily-I asked the patient to take this for the next 3 days and then he can take 20 mg of furosemide every other day or perhaps only Monday Wednesday and Friday  3.  Check a chemistry profile with your office  4.  I recommended a strict low-sodium diet  5.  Follow-up office visit in 10 to 12 months  6.  We will also call the patient and schedule him for further evaluation with a 2D echo Doppler study, we will follow-up on the results and will call if further recommendations.    Total Time Today was 40 minutes in the following activities: Preparing to see the patient, Obtaining and/or reviewing separately obtained history, Performing a medically appropriate examination and/or evaluation, Counseling and educating the patient/family/caregiver, Ordering medications, tests, or procedures, Referring and communication with other health care professionals (when not separately reported), Documenting clinical information in the electronic or other health record, Independently interpreting results (not separately reported) and communicating results to the patient/family/caregiver and Care coordination (not separately reported)         Current Medications (including today's revisions)  ? aspirin 81 mg chewable tablet Chew one tablet by mouth daily.   ? carvediloL (COREG) 25 mg tablet TAKE 2 TABLET BY MOUTH TWICE DAILY WITH FOOD (Patient taking differently: Take two tablets by mouth twice daily. TAKE 2 TABLET BY MOUTH TWICE DAILY WITH FOOD)   ? cycloSPORINE (RESTASIS) 0.05 % ophthalmic emulsion Apply one drop to both eyes twice daily.   ? dilTIAZem CD (CARDIZEM CD) 180 mg capsule TAKE 1 CAPSULE BY MOUTH EVERY DAY   ? DOCOSAHEXANOIC ACID/EPA (FISH OIL PO) Take  by mouth.   ? docusate (COLACE) 100 mg capsule Take one capsule by mouth twice daily.   ? duloxetine DR (CYMBALTA) 60 mg capsule Take 30 mg by mouth daily.   ? fluticasone propionate (FLONASE) 50 mcg/actuation nasal spray, suspension Apply two sprays to each nostril as directed daily. Shake bottle gently before using.   ? fluticasone-salmeterol (WIXELA INHUB) 100-50 mcg inhalation disk INHALE 1 PUFF TWICE A DAY   ? furosemide (LASIX) 40 mg tablet Take one tablet by mouth every morning.   ? GLUCOSAM-CHONDRO-HERB 149-HYAL (GLUCOS CHOND CPLX ADVANCED PO) Take  by mouth.   ? HYDROcodone/acetaminophen (NORCO) 7.5/325 mg tablet Take one tablet by mouth every 6 hours as needed for Pain.   ? krill oil 500 mg Take one capsule by mouth twice daily with meals.   ?  loratadine (CLARITIN) 10 mg tablet Take one tablet by mouth daily.   ? Magnesium 250 mg tab Take  by mouth.   ? metformin-ER(+) (FORTAMET) 1,000 mg tablet Take 500 mg by mouth twice daily.   ? methIMAzole (TAPAZOLE) 10 mg tablet Take 2.5 mg by mouth daily.   ? MULTIVITAMIN W-MINERALS/LUTEIN (CENTRUM SILVER PO) Take  by mouth.   ? potassium chloride (KLOR-CON SPRINKLE) 10 mEq capsule TAKE 1 CAPSULE BY MOUTH TWICE A DAY WITH FOOD AND FULL GLASS OF WATER (Patient taking differently: Take two capsules by mouth four times daily.)   ? prednisolone acetate (PRED FORTE) 1 % ophthalmic suspension Apply one drop to both eyes daily. Paulo Fruit, Friday   ? rivaroxaban (XARELTO) 20 mg tablet Take one tablet by mouth daily. Take with food.   ? rosuvastatin (CRESTOR) 5 mg tablet Take one-half tablet by mouth daily. (Patient taking differently: Take one tablet by mouth daily.)   ? spironolactone (ALDACTONE) 25 mg tablet TAKE 1 TABLET BY MOUTH EVERY DAY WITH FOOD   ? tamsulosin (FLOMAX) 0.4 mg capsule Take one capsule by mouth at bedtime daily.   ? triamcinolone acetonide (KENALOG) 0.1 % topical cream twice daily as needed.

## 2022-02-25 ENCOUNTER — Encounter: Admit: 2022-02-25 | Discharge: 2022-02-25 | Payer: MEDICARE

## 2022-02-25 MED ORDER — DILTIAZEM HCL 180 MG PO CP24
ORAL_CAPSULE | 3 refills | 90.00000 days | Status: AC
Start: 2022-02-25 — End: ?

## 2022-04-11 ENCOUNTER — Encounter: Admit: 2022-04-11 | Discharge: 2022-04-11 | Payer: MEDICARE

## 2022-04-11 MED ORDER — XARELTO 20 MG PO TAB
20 mg | ORAL_TABLET | Freq: Every day | ORAL | 3 refills | 30.00000 days | Status: AC
Start: 2022-04-11 — End: ?

## 2022-04-18 ENCOUNTER — Encounter: Admit: 2022-04-18 | Discharge: 2022-04-18 | Payer: MEDICARE

## 2022-04-18 MED ORDER — RIVAROXABAN 20 MG PO TAB
20 mg | ORAL_TABLET | Freq: Every day | ORAL | 3 refills | 30.00000 days | Status: AC
Start: 2022-04-18 — End: ?

## 2022-04-18 NOTE — Telephone Encounter
Received a request via computer from the patients pharmacy requesting a refill. Script e-scribed as requested.

## 2022-04-19 ENCOUNTER — Encounter: Admit: 2022-04-19 | Discharge: 2022-04-19 | Payer: MEDICARE

## 2022-04-19 MED ORDER — XARELTO 20 MG PO TAB
20 mg | ORAL_TABLET | Freq: Every day | ORAL | 3 refills
Start: 2022-04-19 — End: ?

## 2022-04-19 NOTE — Telephone Encounter
04/19/2022 11:07 AM   Declined xarelto, duplicate request.  Wilder Glade, LPN

## 2022-04-20 ENCOUNTER — Encounter: Admit: 2022-04-20 | Discharge: 2022-04-20 | Payer: MEDICARE

## 2022-04-20 MED ORDER — RIVAROXABAN 20 MG PO TAB
20 mg | ORAL_TABLET | Freq: Every day | ORAL | 3 refills | 30.00000 days | Status: AC
Start: 2022-04-20 — End: ?

## 2022-04-20 NOTE — Telephone Encounter
04/20/2022 11:57 AM   Returned VM to update that refill for Xarelto has been sent to [facility] as requested.

## 2022-05-16 ENCOUNTER — Encounter: Admit: 2022-05-16 | Discharge: 2022-05-16 | Payer: MEDICARE

## 2022-06-01 ENCOUNTER — Encounter: Admit: 2022-06-01 | Discharge: 2022-06-01 | Payer: MEDICARE

## 2022-06-01 ENCOUNTER — Ambulatory Visit: Admit: 2022-06-01 | Discharge: 2022-06-01 | Payer: MEDICARE

## 2022-06-01 DIAGNOSIS — I10 Essential (primary) hypertension: Secondary | ICD-10-CM

## 2022-06-01 DIAGNOSIS — I5032 Chronic diastolic (congestive) heart failure: Secondary | ICD-10-CM

## 2022-06-01 DIAGNOSIS — I4819 Other persistent atrial fibrillation: Secondary | ICD-10-CM

## 2022-06-01 DIAGNOSIS — I4821 Permanent atrial fibrillation: Secondary | ICD-10-CM

## 2022-06-01 MED ORDER — PERFLUTREN LIPID MICROSPHERES 1.1 MG/ML IV SUSP
1-10 mL | Freq: Once | INTRAVENOUS | 0 refills | Status: CP | PRN
Start: 2022-06-01 — End: ?
  Administered 2022-06-01: 22:00:00 1.5 mL via INTRAVENOUS

## 2022-06-06 ENCOUNTER — Encounter: Admit: 2022-06-06 | Discharge: 2022-06-06 | Payer: MEDICARE

## 2022-07-13 ENCOUNTER — Encounter: Admit: 2022-07-13 | Discharge: 2022-07-13 | Payer: MEDICARE

## 2022-07-13 DIAGNOSIS — E782 Mixed hyperlipidemia: Secondary | ICD-10-CM

## 2022-07-13 MED ORDER — ROSUVASTATIN 5 MG PO TAB
5 mg | ORAL_TABLET | Freq: Every day | ORAL | 3 refills | 90.00000 days | Status: AC
Start: 2022-07-13 — End: ?

## 2022-07-13 NOTE — Telephone Encounter
07/13/2022 8:55 AM     Received a request via computer from the patients pharmacy requesting a refill.  Script e-scribed as requested.

## 2022-07-29 ENCOUNTER — Encounter: Admit: 2022-07-29 | Discharge: 2022-07-29 | Payer: MEDICARE

## 2022-08-09 ENCOUNTER — Encounter: Admit: 2022-08-09 | Discharge: 2022-08-09 | Payer: MEDICARE

## 2022-12-21 ENCOUNTER — Encounter: Admit: 2022-12-21 | Discharge: 2022-12-21 | Payer: MEDICARE

## 2023-01-16 ENCOUNTER — Encounter: Admit: 2023-01-16 | Discharge: 2023-01-16 | Payer: MEDICARE

## 2023-01-16 MED ORDER — DILTIAZEM HCL 180 MG PO CP24
ORAL_CAPSULE | 3 refills | 90.00000 days | Status: AC
Start: 2023-01-16 — End: ?

## 2023-01-16 MED ORDER — FUROSEMIDE 20 MG PO TAB
20 mg | ORAL_TABLET | ORAL | 3 refills
Start: 2023-01-16 — End: ?

## 2023-01-16 NOTE — Telephone Encounter
01/16/2023 3:01 PM    Refill refused.  No longer following with [facility]

## 2023-01-16 NOTE — Telephone Encounter
01/16/2023 3:00 PM     Called pt regarding refill request received from pharmacy.  Pt is overdue for OV.  Spoke with pt who states he no longer follows with [facility].  HE is following with [facility].  Informed pt of all Rx's filled by [facility] and asked that he direct all further refills of these medications to his new cardiologist.  Pt verbalized understanding and no further questions.

## 2023-03-20 ENCOUNTER — Encounter: Admit: 2023-03-20 | Discharge: 2023-03-20 | Payer: MEDICARE

## 2023-06-06 ENCOUNTER — Encounter: Admit: 2023-06-06 | Discharge: 2023-06-06 | Payer: MEDICARE

## 2024-02-14 ENCOUNTER — Encounter: Admit: 2024-02-14 | Discharge: 2024-02-14 | Payer: MEDICARE

## 2024-02-19 ENCOUNTER — Encounter: Admit: 2024-02-19 | Discharge: 2024-02-19 | Payer: MEDICARE
